# Patient Record
Sex: Female | Born: 1945 | Race: White | Hispanic: No | Marital: Married | Smoking: Former smoker
Health system: Southern US, Community
[De-identification: ages and names within clinical notes are randomized; demographics above are authoritative.]

## PROBLEM LIST (undated history)

## (undated) DIAGNOSIS — I10 Essential (primary) hypertension: Secondary | ICD-10-CM

## (undated) DIAGNOSIS — R7303 Prediabetes: Secondary | ICD-10-CM

## (undated) DIAGNOSIS — G720 Drug-induced myopathy: Secondary | ICD-10-CM

## (undated) DIAGNOSIS — N289 Disorder of kidney and ureter, unspecified: Secondary | ICD-10-CM

## (undated) DIAGNOSIS — E7800 Pure hypercholesterolemia, unspecified: Secondary | ICD-10-CM

## (undated) DIAGNOSIS — N182 Chronic kidney disease, stage 2 (mild): Secondary | ICD-10-CM

## (undated) DIAGNOSIS — E785 Hyperlipidemia, unspecified: Secondary | ICD-10-CM

## (undated) DIAGNOSIS — N8110 Cystocele, unspecified: Secondary | ICD-10-CM

## (undated) DIAGNOSIS — K219 Gastro-esophageal reflux disease without esophagitis: Secondary | ICD-10-CM

## (undated) DIAGNOSIS — J449 Chronic obstructive pulmonary disease, unspecified: Secondary | ICD-10-CM

## (undated) HISTORY — PX: ABDOMINAL HYSTERECTOMY: SHX81

## (undated) HISTORY — DX: Essential (primary) hypertension: I10

## (undated) HISTORY — DX: Cystocele, unspecified: N81.10

## (undated) HISTORY — PX: EYE SURGERY: SHX253

## (undated) HISTORY — DX: Pure hypercholesterolemia, unspecified: E78.00

## (undated) HISTORY — PX: TUBAL LIGATION: SHX77

## (undated) HISTORY — PX: TONSILLECTOMY: SUR1361

## (undated) HISTORY — PX: APPENDECTOMY: SHX54

## (undated) HISTORY — DX: Chronic kidney disease, stage 2 (mild): N18.2

---

## 2015-02-07 ENCOUNTER — Other Ambulatory Visit: Payer: Self-pay | Admitting: Internal Medicine

## 2015-02-07 DIAGNOSIS — D281 Benign neoplasm of vagina: Secondary | ICD-10-CM

## 2015-02-16 ENCOUNTER — Ambulatory Visit
Admission: RE | Admit: 2015-02-16 | Discharge: 2015-02-16 | Disposition: A | Discharge status: Home / Self Care | Payer: Medicare Other | Source: Ambulatory Visit | Attending: Internal Medicine | Admitting: Internal Medicine

## 2015-02-16 DIAGNOSIS — N858 Other specified noninflammatory disorders of uterus: Secondary | ICD-10-CM | POA: Insufficient documentation

## 2015-02-16 DIAGNOSIS — D281 Benign neoplasm of vagina: Secondary | ICD-10-CM | POA: Diagnosis not present

## 2015-02-16 DIAGNOSIS — Z86018 Personal history of other benign neoplasm: Secondary | ICD-10-CM | POA: Insufficient documentation

## 2015-10-15 ENCOUNTER — Other Ambulatory Visit: Payer: Self-pay | Admitting: Internal Medicine

## 2015-10-15 DIAGNOSIS — Z1231 Encounter for screening mammogram for malignant neoplasm of breast: Secondary | ICD-10-CM

## 2015-10-26 ENCOUNTER — Other Ambulatory Visit: Payer: Self-pay | Admitting: Internal Medicine

## 2015-10-26 ENCOUNTER — Ambulatory Visit
Admission: RE | Admit: 2015-10-26 | Discharge: 2015-10-26 | Disposition: A | Discharge status: Home / Self Care | Payer: Medicare Other | Source: Ambulatory Visit | Attending: Internal Medicine | Admitting: Internal Medicine

## 2015-10-26 DIAGNOSIS — Z1231 Encounter for screening mammogram for malignant neoplasm of breast: Secondary | ICD-10-CM | POA: Insufficient documentation

## 2016-09-10 ENCOUNTER — Other Ambulatory Visit: Payer: Self-pay | Admitting: Internal Medicine

## 2016-09-10 DIAGNOSIS — Z1231 Encounter for screening mammogram for malignant neoplasm of breast: Secondary | ICD-10-CM

## 2016-10-27 ENCOUNTER — Other Ambulatory Visit: Payer: Self-pay | Admitting: Internal Medicine

## 2016-10-27 ENCOUNTER — Ambulatory Visit
Admission: RE | Admit: 2016-10-27 | Discharge: 2016-10-27 | Disposition: A | Discharge status: Home / Self Care | Payer: Medicare Other | Source: Ambulatory Visit | Attending: Internal Medicine | Admitting: Internal Medicine

## 2016-10-27 DIAGNOSIS — Z1231 Encounter for screening mammogram for malignant neoplasm of breast: Secondary | ICD-10-CM | POA: Insufficient documentation

## 2017-01-06 ENCOUNTER — Encounter: Payer: Self-pay | Admitting: Obstetrics & Gynecology

## 2017-01-06 ENCOUNTER — Ambulatory Visit (INDEPENDENT_AMBULATORY_CARE_PROVIDER_SITE_OTHER): Payer: Medicare Other | Admitting: Obstetrics & Gynecology

## 2017-01-06 VITALS — BP 153/72 | HR 58 | Ht 63.0 in | Wt 143.9 lb

## 2017-01-06 DIAGNOSIS — N8111 Cystocele, midline: Secondary | ICD-10-CM | POA: Diagnosis present

## 2017-01-06 DIAGNOSIS — N393 Stress incontinence (female) (male): Secondary | ICD-10-CM

## 2017-01-06 DIAGNOSIS — N814 Uterovaginal prolapse, unspecified: Secondary | ICD-10-CM

## 2017-01-06 NOTE — Patient Instructions (Signed)
Anterior and Posterior Colporrhaphy and Sling Procedure Anterior and posterior colporrhaphy and sling procedure are combined surgical procedures that treat weakness in the front (anterior) or back (posterior) walls of your vagina. When weakness occurs in the anterior wall of the vagina, the bladder can bulge into the vagina (cystocele). When weakness occurs in the posterior wall of the vagina, the rectum can bulge into the vagina (rectocele). This condition is called pelvic organ prolapse. In this procedure, a surgical mesh sling will be placed around the tube that empties urine from your bladder (urethra). The sling will hold your urethra and bladder in place to prevent urine leakage (incontinence). This surgery is usually done through incisions in your vagina. It can also be done through incisions in your lower abdominal or groin area. You may need this surgery if pelvic organ prolapse causes symptoms that interfere with your daily life and cannot be corrected with other treatments. Tell a health care provider about:  Any allergies you have.  All medicines you are taking, including vitamins, herbs, eye drops, creams, and over-the-counter medicines.  Any problems you or family members have had with anesthetic medicines.  Any blood disorders you have.  Any surgeries you have had.  Any medical conditions you have.  Whether you are pregnant or may be pregnant. What are the risks? Generally, this is a safe procedure. However, problems may occur, including:  Infection.  Bleeding.  Allergic reactions to medicines or dyes.  Damage to other structures or organs.  Incontinence.  A blood clot that travels to your lung.  Nerve damage.  Painful sex.  Urine leakage into the vagina.  Constipation.  Tissue damage from the sling over time. The sling may need to be removed.  What happens before the procedure? Staying hydrated  Follow instructions from your health care provider about  hydration, which may include: ? Up to 2 hours before the procedure - you may continue to drink clear liquids, such as water, clear fruit juice, black coffee, and plain tea. Eating and drinking restrictions  Follow instructions from your health care provider about eating and drinking, which may include: ? 8 hours before the procedure - stop eating heavy meals or foods such as meat, fried foods, or fatty foods. ? 6 hours before the procedure - stop eating light meals or foods, such as toast or cereal. ? 6 hours before the procedure - stop drinking milk or drinks that contain milk. ? 2 hours before the procedure - stop drinking clear liquids. General instructions  Ask your health care provider about: ? Changing or stopping your regular medicines. This is especially important if you are taking diabetes medicines or blood thinners. ? Taking medicines such as aspirin and ibuprofen. These medicines can thin your blood. Do not take these medicines before your procedure if your health care provider instructs you not to.  You may be given antibiotics to help prevent infection.  You may be instructed to use estrogen cream in your vagina to help prevent complications and promote healing.  Plan to have someone take you home from the hospital. What happens during the procedure?  To reduce your risk of infection: ? Your health care team will wash or sanitize their hands. ? Your skin will be washed with soap. ? Hair may be removed from the surgical area.  An IV tube may be inserted into one of your veins.  You will be given one or more of the following: ? A medicine to help you relax (sedative). ?  A medicine that is injected into your spine to numb the area below and slightly above the injection site (spinal anesthetic). ? A medicine to make you fall asleep (general anesthetic).  You may be given antibiotics through your IV.  You will lie down on the operating table with your feet in  stirrups.  A small, thin tube (catheter) will be inserted through your urethra into your bladder to drain urine during surgery and recovery.  An instrument (vaginal speculum) will be used to hold your vagina open.  To correct a cystocele: ? An incision will be made in the front wall of your vagina. ? Your bladder will be placed back into normal position. ? Weak or excess vaginal lining may be removed. ? The front wall of your vagina will be closed with stitches (sutures).  To correct a rectocele: ? An incision will be made in the back wall of your vagina. ? Your rectum will be placed back into normal position. ? Weak or excess vaginal lining may be removed. ? The back wall of your vagina will be closed with sutures.  To place a sling: ? Small incisions may be made inside your vagina or in your lower abdominal or groin area to insert the sling. ? The sling will be placed around your urethra and attached to strong tissues inside your abdomen. ? These incisions may be closed with sutures or glue.  Gauze packing will be placed inside your vagina.  The procedure may vary among health care providers and hospitals. What happens after the procedure?  Your blood pressure, heart rate, breathing rate, and blood oxygen level will be monitored until the medicines you were given have worn off.  You will be given pain medicine as needed.  You will start on a liquid diet and move to a regular diet.  You will be encouraged to get up and walk as soon as you are able.  You may need to wear compression stockings. They help prevent blood clots and reduce swelling in your legs.  Your IV, urinary catheter, and vaginal packing may be removed before you go home.  Do not drive for 24 hours if you were given a sedative. Summary  Anterior and posterior colporrhaphy and sling procedure are combined surgical procedures that treat weakness in the anterior or posteriorwalls of your vagina.  Plan to have  someone take you home from the hospital or clinic.  After the procedure, you will be encouraged to start drinking and eating and be up and walking as soon as you are able.  The IV, urinary catheter, and vaginal packing may be removed before you go home. This information is not intended to replace advice given to you by your health care provider. Make sure you discuss any questions you have with your health care provider. Document Released: 06/16/2016 Document Revised: 06/16/2016 Document Reviewed: 06/16/2016 Elsevier Interactive Patient Education  2018 [employer]. Urinary Incontinence Urinary incontinence is the involuntary loss of urine from your bladder. What are the causes? There are many causes of urinary incontinence. They include:  Medicines.  Infections.  Prostatic enlargement, leading to overflow of urine from your bladder.  Surgery.  Neurological diseases.  Emotional factors.  What are the signs or symptoms? Urinary Incontinence can be divided into four types: 1. Urge incontinence. Urge incontinence is the involuntary loss of urine before you have the opportunity to go to the bathroom. There is a sudden urge to void but not enough time to reach a bathroom.  2. Stress incontinence. Stress incontinence is the sudden loss of urine with any activity that forces urine to pass. It is commonly caused by anatomical changes to the pelvis and sphincter areas of your body. 3. Overflow incontinence. Overflow incontinence is the loss of urine from an obstructed opening to your bladder. This results in a backup of urine and a resultant buildup of pressure within the bladder. When the pressure within the bladder exceeds the closing pressure of the sphincter, the urine overflows, which causes incontinence, similar to water overflowing a dam. 4. Total incontinence. Total incontinence is the loss of urine as a result of the inability to store urine within your bladder.  How is this  diagnosed? Evaluating the cause of incontinence may require:  A thorough and complete medical and obstetric history.  A complete physical exam.  Laboratory tests such as a urine culture and sensitivities.  When additional tests are indicated, they can include:  An ultrasound exam.  Kidney and bladder X-rays.  Cystoscopy. This is an exam of the bladder using a narrow scope.  Urodynamic testing to test the nerve function to the bladder and sphincter areas.  How is this treated? Treatment for urinary incontinence depends on the cause:  For urge incontinence caused by a bacterial infection, antibiotics will be prescribed. If the urge incontinence is related to medicines you take, your health care provider may have you change the medicine.  For stress incontinence, surgery to re-establish anatomical support to the bladder or sphincter, or both, will often correct the condition.  For overflow incontinence caused by an enlarged prostate, an operation to open the channel through the enlarged prostate will allow the flow of urine out of the bladder. In women with fibroids, a hysterectomy may be recommended.  For total incontinence, surgery on your urinary sphincter may help. An artificial urinary sphincter (an inflatable cuff placed around the urethra) may be required. In women who have developed a hole-like passage between their bladder and vagina (vesicovaginal fistula), surgery to close the fistula often is required.  Follow these instructions at home:  Normal daily hygiene and the use of pads or adult diapers that are changed regularly will help prevent odors and skin damage.  Avoid caffeine. It can overstimulate your bladder.  Use the bathroom regularly. Try about every 2-3 hours to go to the bathroom, even if you do not feel the need to do so. Take time to empty your bladder completely. After urinating, wait a minute. Then try to urinate again.  For causes involving nerve  dysfunction, keep a log of the medicines you take and a journal of the times you go to the bathroom. Contact a health care provider if:  You experience worsening of pain instead of improvement in pain after your procedure.  Your incontinence becomes worse instead of better. Get help right away if:  You experience fever or shaking chills.  You are unable to pass your urine.  You have redness spreading into your groin or down into your thighs. This information is not intended to replace advice given to you by your health care provider. Make sure you discuss any questions you have with your health care provider. Document Released: 07/24/2004 Document Revised: 01/25/2016 Document Reviewed: 11/23/2012 Elsevier Interactive Patient Education  [employer].

## 2017-01-06 NOTE — Progress Notes (Signed)
History:  71 y.o. 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 Her LMP was in her 74's. She was referred for eval of prolapse. She is not sure what is prolapsing. She finds it uncomfortable. She is very active with gardening and wants surgical management. She reports that she was seen prev by a GYN who tired 2 types of pessaries with no success. She reports that for the past month she has had leakage of urine with activity but, prior to 1 month prev she had no leakage.   The following portions of the patient's history were reviewed and updated as appropriate: allergies, current medications, past family history, past medical history, past social history, past surgical history and problem list.  Review of Systems:  Pertinent items are noted in HPI.   Objective:  Physical Exam Blood pressure (!) 153/72, pulse (!) 58, height 5\' 3"  (1.6 m), weight 143 lb 14.4 oz (65.3 kg), last menstrual period 11/28/2016. Gen: Very please and well appearing pt in NAD.  Abd: Soft, nontender and nondistended Pelvic: Normal appearing external genitalia; normal appearing vaginal mucosa and cervix.  There is a grade III cystocele.  Normal discharge.  Small uterus which is mildly prolapsed, no other palpable masses, no uterine or adnexal tenderness  PESSARY FITTING AND PLACEMENT During pelvic exam, patient was examined. Her vaginal introitus size, vaginal length, and prolapse stage were measured. Despite trying multiple pessaries of different sizes and types ALL of the attempted pessaries fell out with activity.  .   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 I attempted several pessaries but, even the largest one fell out with activity.   Schedule TVH with BSO and ant repair and sling with Dr. Hulan Fray and myself. Will pt appt with Dr. Hulan Fray prior to surgery.   Patient desires surgical management with TVH with ant repair and sling with Dr. Hulan Fray.  The risks of surgery were  discussed in detail with the patient including but not limited to: bleeding which may require transfusion or reoperation; infection which may require prolonged hospitalization or re-hospitalization and antibiotic therapy; injury to bowel, bladder, ureters and major vessels or other surrounding organs; need for additional procedures including laparotomy; thromboembolic phenomenon, incisional problems and other postoperative or anesthesia complications.  Patient was told that the likelihood that her condition and symptoms will be treated effectively with this surgical management was very high; the postoperative expectations were also discussed in detail. The patient also understands the alternative treatment options which were discussed in full. All questions were answered.  She was told that she will be contacted by our surgical scheduler regarding the time and date of her surgery; routine preoperative instructions of having nothing to eat or drink after midnight on the day prior to surgery and also coming to the hospital 1 1/2 hours prior to her time of surgery were also emphasized.  She was told she may be called for a preoperative appointment about a week prior to surgery and will be given further preoperative instructions at that visit. Printed patient education handouts about the procedure were given to the patient to review at home.  Total face-to-face time with patient was 30 min.  Greater than 50% was spent in counseling and coordination of care with the patient.   Calan Doren L. Ihor Dow, M.D., [place]   .   Marland Kitchen

## 2017-01-14 ENCOUNTER — Ambulatory Visit (INDEPENDENT_AMBULATORY_CARE_PROVIDER_SITE_OTHER): Payer: Medicare Other | Admitting: Obstetrics and Gynecology

## 2017-01-14 ENCOUNTER — Encounter: Payer: Self-pay | Admitting: Obstetrics and Gynecology

## 2017-01-14 VITALS — BP 139/78 | HR 56 | Ht 63.0 in | Wt 144.9 lb

## 2017-01-14 DIAGNOSIS — N816 Rectocele: Secondary | ICD-10-CM | POA: Insufficient documentation

## 2017-01-14 DIAGNOSIS — N3941 Urge incontinence: Secondary | ICD-10-CM | POA: Insufficient documentation

## 2017-01-14 DIAGNOSIS — N813 Complete uterovaginal prolapse: Secondary | ICD-10-CM | POA: Insufficient documentation

## 2017-01-14 DIAGNOSIS — R339 Retention of urine, unspecified: Secondary | ICD-10-CM | POA: Diagnosis not present

## 2017-01-14 DIAGNOSIS — N8111 Cystocele, midline: Secondary | ICD-10-CM | POA: Insufficient documentation

## 2017-01-14 DIAGNOSIS — N952 Postmenopausal atrophic vaginitis: Secondary | ICD-10-CM

## 2017-01-14 HISTORY — DX: Retention of urine, unspecified: R33.9

## 2017-01-14 HISTORY — DX: Postmenopausal atrophic vaginitis: N95.2

## 2017-01-14 HISTORY — DX: Urge incontinence: N39.41

## 2017-01-14 MED ORDER — MIRABEGRON ER 25 MG PO TB24: 25.0000 mg | ORAL_TABLET | Freq: Every day | ORAL | 6 refills | Status: DC

## 2017-01-14 MED ORDER — MIRABEGRON ER 25 MG PO TB24: 25.0000 mg | ORAL_TABLET | Freq: Every day | ORAL | Status: DC

## 2017-01-14 MED ORDER — ESTROGENS, CONJUGATED 0.625 MG/GM VA CREA: 0.5000 | TOPICAL_CREAM | Freq: Every day | VAGINAL | 12 refills | Status: DC

## 2017-01-14 NOTE — Patient Instructions (Addendum)
1. Schedule TVH BSO with anterior/posterior colporrhaphy for beginning of September 2018 2. Begin Merbetriq daily for urge incontinence 3. Begin Premarin cream 1/2 g intravaginal nightly for 30 days; then twice a week 4. Return for preoperative appointment week before surgery was

## 2017-01-14 NOTE — Progress Notes (Signed)
GYN ENCOUNTER NOTE  Subjective:  NEW PATIENT EVALUATION        Haley Mason is a 71 y.o. [phone] female is here for gynecologic evaluation of the following issues:  1. Pelvic organ prolapse, symptomatic  Status post NSVD 6 Recently evaluated at group clinic for pessary management for symptomatic pelvic organ prolapse without successful fitting of a functional pessary. Patient is not interested in further pessary trials. Patient was told that she needed TVH BSO with a sling procedure for her urinary symptoms and prolapse.  GU history: Frequency-10-20 per day Nocturia-3-4 per night She does report incomplete bladder emptying. She does report significant urgency with loss of urine when unable to get to the bathroom She has not had a trial of unstable bladder medications. She does not report any stress urinary incontinence symptoms. She does not wear any pads Patient does have chronic pelvic pressure with total prolapse of the uterus outside the introitus.  GI history: Bowel movement frequency-daily Patient does report mild splinting with bowel movements.  Patient is not currently sexually active because of the previous experience with painful intercourse related to the prolapse.     Gynecologic History Patient's last menstrual period was 11/28/2016 (within days). Contraception: post menopausal status   Obstetric History OB History  Gravida Para Term Preterm AB Living  6 6 6     6   SAB TAB Ectopic Multiple Live Births          6    # Outcome Date GA Lbr Len/2nd Weight Sex Delivery Anes PTL Lv  6 Term 1978   6 lb 8 oz (2.948 kg) F Vag-Spont   LIV  5 Term 1975   6 lb 12.8 oz (3.084 kg) F Vag-Spont   LIV  4 Term 1969   7 lb 4.8 oz (3.311 kg) M Vag-Spont   LIV  3 Term 1965   6 lb (2.722 kg) M Vag-Spont   LIV  2 Term 1964   5 lb 2.1 oz (2.327 kg) F Vag-Spont   LIV  1 Term 1962   6 lb 6.4 oz (2.903 kg) M Vag-Spont   LIV      Past Medical History:  Diagnosis Date  .  Cystocele, unspecified (CODE)   . High cholesterol   . Hypertension   . 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 TONSILLECTOMY    . TUBAL LIGATION      Current Outpatient Prescriptions on File Prior to Visit  Medication Sig Dispense Refill  . atenolol (TENORMIN) 25 MG tablet Take by mouth daily.    . Cholecalciferol (VITAMIN D3) 2000 units TABS Take by mouth.    . colesevelam (WELCHOL) 625 MG tablet Take 625 mg by mouth 2 (two) times daily with a meal.     No current facility-administered medications on file prior to visit.     Allergies  Allergen Reactions  . Penicillins Other (See Comments)    Loses muscle control  . Prednisone Nausea And Vomiting    Loses muscle control    Social History   Social History  . Marital status: Married    Spouse name: N/A  . Number of children: N/A  . Years of education: N/A   Occupational History  . Not on file.   Social History Main Topics  . Smoking status: Former Smoker    Quit date: 1991  . Smokeless tobacco: Never Used  . Alcohol use  No  . Drug use: No  . Sexual activity: Not Currently    Birth control/ protection: Surgical   Other Topics Concern  . Not on file   Social History Narrative  . No narrative on file    Family History  Problem Relation Age of Onset  . Diabetes Mother   . Heart disease Mother   . Diabetes Sister   . Diabetes Maternal Aunt   . Breast cancer Neg Hx   . Ovarian cancer Neg Hx   . Colon cancer Neg Hx     The following portions of the patient's history were reviewed and updated as appropriate: allergies, current medications, past family history, past medical history, past social history, past surgical history and problem list.  Review of Systems Per history of present illness Objective:   BP 139/78   Pulse (!) 56   Ht 5\' 3"  (1.6 m)   Wt 144 lb 14.4 oz (65.7 kg)   LMP 11/28/2016 (Within Days) Comment: spotting  BMI  25.67 kg/m  CONSTITUTIONAL: Well-developed, well-nourished female in no acute distress. Alert and oriented HENT:  Normocephalic, atraumatic.  NECK: Not examined.  SKIN: Skin is warm and dry. No rash noted. Not diaphoretic. No erythema. No pallor. [place]: Alert and oriented to person, place, and time. PSYCHIATRIC: Normal mood and affect. Normal behavior. Normal judgment and thought content. CARDIOVASCULAR:Not Examined RESPIRATORY: Not Examined BREASTS: Not Examined ABDOMEN: Soft, non distended; Non tender.  No Organomegaly. Questionable palpable hernia on right abdominal wall not exacerbated with Valsalva BACK: No CVA tenderness or spinal tenderness PELVIC:  External Genitalia: Normal  BUS: Normal  Vagina: Fair estrogen effect; third-degree traction cystocele present; moderate rectocele  Cervix: Normal; prolapsed outside introitus; no gross lesions  Uterus: Procidentia evident; uterus has Normal size, shape,consistency, mobility  Adnexa: Normal; nonpalpable nontender  RV: Normal external exam; normal sphincter tone; rectocele is confirmed  Bladder: Nontender MUSCULOSKELETAL: Normal range of motion. No tenderness.  No cyanosis, clubbing, or edema.  PROCEDURE: Postvoid residual Verbal consent is obtained. Patient is sent to the bathroom and empties bladder. Upon returning she was placed in the dorsal lithotomy position. The urethra is swabbed with Betadine swabs. Red Robinson catheter is used to drain the bladder of 60 mL of urine. Procedure was well-tolerated.   Assessment:   1. Urge incontinence  2. Procidentia of uterus  3. Cystocele, midline, Traction, third-degree  4. Rectocele, moderate, minimally symptomatic  5. Vaginal atrophy  6. Incomplete bladder emptying   The patient does not have any stress urinary incontinence symptoms and does not require a sling procedure at this time. She does understand that oftentimes with repair of pelvic organ prolapse, the patient may  develop stress urinary incontinence with subsequent need for sling later on.  Plan:   1. Post void residual is noted-60 mL 2. Premarin cream intravaginal 1/2 g nightly 30 days, then twice weekly 3. Trial of Merbetriq 25 mg daily 4. Return the end of August for follow-up and further preoperative assessment prior to surgery scheduled for September 5. TVH BSO with anterior/posterior colporrhaphy is to be scheduled for early September  A total of 45 minutes were spent face-to-face with the patient during the encounter with greater than 50% dealing with counseling and coordination of care.  Brayton Mars, MD  Note: This dictation was prepared with Dragon dictation along with smaller phrase technology. Any transcriptional errors that result from this process are unintentional.

## 2017-01-15 ENCOUNTER — Encounter (HOSPITAL_COMMUNITY): Payer: Self-pay

## 2017-01-15 ENCOUNTER — Telehealth: Payer: Self-pay | Admitting: Obstetrics & Gynecology

## 2017-01-15 NOTE — Telephone Encounter (Signed)
Per patient her family has found her a local doctor, and she wants to cancel this appointment.

## 2017-01-15 NOTE — Addendum Note (Signed)
Addended by: Elouise Munroe on: 01/15/2017 11:11 AM   Modules accepted: Orders

## 2017-01-16 ENCOUNTER — Encounter: Payer: Medicare Other | Admitting: Obstetrics & Gynecology

## 2017-01-17 LAB — URINE CULTURE

## 2017-01-19 ENCOUNTER — Telehealth: Payer: Self-pay | Admitting: Obstetrics and Gynecology

## 2017-01-19 NOTE — Telephone Encounter (Signed)
Patient called wanting to speak with "Dr.De's nurse Joyice Faster" The patient would like to speak with C.M. In regards to her wanting to get/pick up a prescription but was not able to get the medication. Patient did not disclose any other information. Please advise.

## 2017-01-19 NOTE — Telephone Encounter (Signed)
Pt states Venora Maples is too expensive. She states she does not want any other med at this time. She does not like the side effects. Aware I will let mad know.

## 2017-03-03 ENCOUNTER — Encounter: Payer: Self-pay | Admitting: Obstetrics and Gynecology

## 2017-03-03 ENCOUNTER — Ambulatory Visit (INDEPENDENT_AMBULATORY_CARE_PROVIDER_SITE_OTHER): Payer: Medicare Other | Admitting: Obstetrics and Gynecology

## 2017-03-03 VITALS — BP 125/79 | HR 70 | Ht 63.0 in | Wt 145.0 lb

## 2017-03-03 DIAGNOSIS — N816 Rectocele: Secondary | ICD-10-CM

## 2017-03-03 DIAGNOSIS — N813 Complete uterovaginal prolapse: Secondary | ICD-10-CM

## 2017-03-03 DIAGNOSIS — Z01818 Encounter for other preprocedural examination: Secondary | ICD-10-CM

## 2017-03-03 DIAGNOSIS — N8111 Cystocele, midline: Secondary | ICD-10-CM

## 2017-03-03 MED ORDER — LACTATED RINGERS IV SOLN: INTRAVENOUS | Status: DC

## 2017-03-03 NOTE — Progress Notes (Signed)
Subjective:  PREOPERATIVE HISTORY AND PHYSICAL   Date of surgery: 03/09/2017 Procedure: TVH BSO with anterior/posterior colporrhaphy Diagnosis: 1. Procidentia 2. Cystocele, third-degree 3. Rectocele   Patient is a 71 y.o. [id number] scheduled for TVH BSO with anterior/posterior colporrhaphy on 03/09/2017. The patient has symptomatic pelvic organ prolapse.  Status post NSVD 6 Recently evaluated at group clinic for pessary management for symptomatic pelvic organ prolapse without successful fitting of a functional pessary. Patient is not interested in further pessary trials. Patient was told that she needed TVH BSO with a sling procedure for her urinary symptoms and prolapse.  GU history: Frequency-10-20 per day Nocturia-3-4 per night She does report incomplete bladder emptying. She does report significant urgency with loss of urine when unable to get to the bathroom She has not had a trial of unstable bladder medications. She does not report any stress urinary incontinence symptoms. She does not wear any pads Patient does have chronic pelvic pressure with total prolapse of the uterus outside the introitus. GI history: Bowel movement frequency-daily Patient does report mild splinting with bowel movements.  Patient is not currently sexually active because of the previous experience with painful intercourse related to the prolapse  Gynecologic History Patient's last menstrual period was 11/28/2016 (within days). Contraception: post menopausal status   OB History    Gravida Para Term Preterm AB Living   6 6 6     6    SAB TAB Ectopic Multiple Live Births           6      Patient's last menstrual period was 11/28/2016 (within days).    Past Medical History:  Diagnosis Date  . Cystocele, unspecified (CODE)   . High cholesterol   . Hypertension   . 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  TONSILLECTOMY    . TUBAL LIGATION      OB History  Gravida Para Term Preterm AB Living  6 6 6     6   SAB TAB Ectopic Multiple Live Births          6    # Outcome Date GA Lbr Len/2nd Weight Sex Delivery Anes PTL Lv  6 Term 1978   6 lb 8 oz (2.948 kg) F Vag-Spont   LIV  5 Term 1975   6 lb 12.8 oz (3.084 kg) F Vag-Spont   LIV  4 Term 1969   7 lb 4.8 oz (3.311 kg) M Vag-Spont   LIV  3 Term 1965   6 lb (2.722 kg) M Vag-Spont   LIV  2 Term 1964   5 lb 2.1 oz (2.327 kg) F Vag-Spont   LIV  1 Term 1962   6 lb 6.4 oz (2.903 kg) M Vag-Spont   LIV      Social History   Social History  . Marital status: Married    Spouse name: N/A  . Number of children: N/A  . Years of education: N/A   Social History Main Topics  . Smoking status: Former Smoker    Quit date: 1991  . Smokeless tobacco: Never Used  . Alcohol use No  . Drug use: No  . Sexual activity: Not Currently    Birth control/ protection: Surgical   Other Topics Concern  . None   Social History Narrative  . None    Family History  Problem Relation Age of Onset  . Diabetes Mother   . Heart  disease Mother   . Diabetes Sister   . Diabetes Maternal Aunt   . Breast cancer Neg Hx   . Ovarian cancer Neg Hx   . Colon cancer Neg Hx      (Not in a hospital admission)  Allergies  Allergen Reactions  . Penicillins Other (See Comments)    Loses muscle control  . Prednisone Nausea And Vomiting    Loses muscle control    Review of Systems Constitutional: No recent fever/chills/sweats Respiratory: No recent cough/bronchitis Cardiovascular: No chest pain Gastrointestinal: No recent nausea/vomiting/diarrhea Genitourinary: No UTI symptoms Hematologic/lymphatic:No history of coagulopathy or recent blood thinner use    Objective:    BP 125/79   Pulse 70   Ht 5\' 3"  (1.6 m)   Wt 145 lb (65.8 kg)   LMP 11/28/2016 (Within Days) Comment: spotting  BMI 25.69 kg/m   General:   Normal  Skin:   normal  HEENT:  Normal   Neck:  Supple without Adenopathy or Thyromegaly  Lungs:   Heart:              Breasts:   Abdomen:  Pelvis:  M/S   Extremeties:  Neuro:    clear to auscultation bilaterally   Normal without murmur   Not Examined   soft, non-tender; bowel sounds normal; no masses,  no organomegaly   Exam deferred to OR  No CVAT  Warm/Dry   Normal          Assessment:  1. Uterine procidentia 2. Cystocele, third-degree, traction, midline 3. Rectocele, moderate, minimally symptomatic 4. Urge incontinence 5. Incomplete bladder emptying 6. Vaginal atrophy   Plan:  TVH BSO with anterior/posterior colporrhaphy    Preop counseling: The patient is to undergo TVH BSO with anterior/posterior colporrhaphy for management of symptomatic pelvic organ prolapse. She is understanding of the planned procedures and is aware of and is accepting of all surgical risks which include but are not limited to bleeding, infection, pelvic organ injury with need for repair, blood clot disorders, anesthesia risks, etc. Patient does understand that stress incontinence may develop after the surgery which may require a sling in the future. She is accepting of all risks and wishes to proceed with surgery. All questions have been answered.  Brayton Mars, MD  Note: This dictation was prepared with Dragon dictation along with smaller phrase technology. Any transcriptional errors that result from this process are unintentional.

## 2017-03-03 NOTE — Patient Instructions (Signed)
Return for postop check 1 week after surgery

## 2017-03-03 NOTE — H&P (Signed)
Subjective:  PREOPERATIVE HISTORY AND PHYSICAL   Date of surgery: 03/09/2017 Procedure: TVH BSO with anterior/posterior colporrhaphy Diagnosis: 1. Procidentia 2. Cystocele, third-degree 3. Rectocele   Patient is a 71 y.o. [id number] scheduled for TVH BSO with anterior/posterior colporrhaphy on 03/09/2017. The patient has symptomatic pelvic organ prolapse.  Status post NSVD 6 Recently evaluated at group clinic for pessary management for symptomatic pelvic organ prolapse without successful fitting of a functional pessary. Patient is not interested in further pessary trials. Patient was told that she needed TVH BSO with a sling procedure for her urinary symptoms and prolapse.  GU history: Frequency-10-20 per day Nocturia-3-4 per night She does report incomplete bladder emptying. She does report significant urgency with loss of urine when unable to get to the bathroom She has not had a trial of unstable bladder medications. She does not report any stress urinary incontinence symptoms. She does not wear any pads Patient does have chronic pelvic pressure with total prolapse of the uterus outside the introitus. GI history: Bowel movement frequency-daily Patient does report mild splinting with bowel movements.  Patient is not currently sexually active because of the previous experience with painful intercourse related to the prolapse  Gynecologic History Patient's last menstrual period was 11/28/2016 (within days). Contraception: post menopausal status   OB History    Gravida Para Term Preterm AB Living   6 6 6     6    SAB TAB Ectopic Multiple Live Births           6      Patient's last menstrual period was 11/28/2016 (within days).    Past Medical History:  Diagnosis Date  . Cystocele, unspecified (CODE)   . High cholesterol   . Hypertension   . 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  TONSILLECTOMY    . TUBAL LIGATION      OB History  Gravida Para Term Preterm AB Living  6 6 6     6   SAB TAB Ectopic Multiple Live Births          6    # Outcome Date GA Lbr Len/2nd Weight Sex Delivery Anes PTL Lv  6 Term 1978   6 lb 8 oz (2.948 kg) F Vag-Spont   LIV  5 Term 1975   6 lb 12.8 oz (3.084 kg) F Vag-Spont   LIV  4 Term 1969   7 lb 4.8 oz (3.311 kg) M Vag-Spont   LIV  3 Term 1965   6 lb (2.722 kg) M Vag-Spont   LIV  2 Term 1964   5 lb 2.1 oz (2.327 kg) F Vag-Spont   LIV  1 Term 1962   6 lb 6.4 oz (2.903 kg) M Vag-Spont   LIV      Social History   Social History  . Marital status: Married    Spouse name: N/A  . Number of children: N/A  . Years of education: N/A   Social History Main Topics  . Smoking status: Former Smoker    Quit date: 1991  . Smokeless tobacco: Never Used  . Alcohol use No  . Drug use: No  . Sexual activity: Not Currently    Birth control/ protection: Surgical   Other Topics Concern  . None   Social History Narrative  . None    Family History  Problem Relation Age of Onset  . Diabetes Mother   . Heart  disease Mother   . Diabetes Sister   . Diabetes Maternal Aunt   . Breast cancer Neg Hx   . Ovarian cancer Neg Hx   . Colon cancer Neg Hx      (Not in a hospital admission)  Allergies  Allergen Reactions  . Penicillins Other (See Comments)    Loses muscle control  . Prednisone Nausea And Vomiting    Loses muscle control    Review of Systems Constitutional: No recent fever/chills/sweats Respiratory: No recent cough/bronchitis Cardiovascular: No chest pain Gastrointestinal: No recent nausea/vomiting/diarrhea Genitourinary: No UTI symptoms Hematologic/lymphatic:No history of coagulopathy or recent blood thinner use    Objective:    BP 125/79   Pulse 70   Ht 5\' 3"  (1.6 m)   Wt 145 lb (65.8 kg)   LMP 11/28/2016 (Within Days) Comment: spotting  BMI 25.69 kg/m   General:   Normal  Skin:   normal  HEENT:  Normal   Neck:  Supple without Adenopathy or Thyromegaly  Lungs:   Heart:              Breasts:   Abdomen:  Pelvis:  M/S   Extremeties:  Neuro:    clear to auscultation bilaterally   Normal without murmur   Not Examined   soft, non-tender; bowel sounds normal; no masses,  no organomegaly   Exam deferred to OR  No CVAT  Warm/Dry   Normal          Assessment:  1. Uterine procidentia 2. Cystocele, third-degree, traction, midline 3. Rectocele, moderate, minimally symptomatic 4. Urge incontinence 5. Incomplete bladder emptying 6. Vaginal atrophy   Plan:  TVH BSO with anterior/posterior colporrhaphy    Preop counseling: The patient is to undergo TVH BSO with anterior/posterior colporrhaphy for management of symptomatic pelvic organ prolapse. She is understanding of the planned procedures and is aware of and is accepting of all surgical risks which include but are not limited to bleeding, infection, pelvic organ injury with need for repair, blood clot disorders, anesthesia risks, etc. Patient does understand that stress incontinence may develop after the surgery which may require a sling in the future. She is accepting of all risks and wishes to proceed with surgery. All questions have been answered.  Brayton Mars, MD  Note: This dictation was prepared with Dragon dictation along with smaller phrase technology. Any transcriptional errors that result from this process are unintentional.

## 2017-03-05 ENCOUNTER — Encounter
Admission: RE | Admit: 2017-03-05 | Discharge: 2017-03-05 | Disposition: A | Discharge status: Home / Self Care | Payer: Medicare Other | Source: Ambulatory Visit | Attending: Obstetrics and Gynecology | Admitting: Obstetrics and Gynecology

## 2017-03-05 DIAGNOSIS — I10 Essential (primary) hypertension: Secondary | ICD-10-CM | POA: Diagnosis not present

## 2017-03-05 DIAGNOSIS — I498 Other specified cardiac arrhythmias: Secondary | ICD-10-CM | POA: Insufficient documentation

## 2017-03-05 DIAGNOSIS — Z01818 Encounter for other preprocedural examination: Secondary | ICD-10-CM | POA: Insufficient documentation

## 2017-03-05 DIAGNOSIS — N3941 Urge incontinence: Secondary | ICD-10-CM | POA: Diagnosis not present

## 2017-03-05 DIAGNOSIS — N813 Complete uterovaginal prolapse: Secondary | ICD-10-CM | POA: Diagnosis not present

## 2017-03-05 LAB — TYPE AND SCREEN
ABO/RH(D): B POS
Antibody Screen: NEGATIVE

## 2017-03-05 LAB — CBC WITH DIFFERENTIAL/PLATELET
BASOS ABS: 0.1 10*3/uL (ref 0–0.1)
Basophils Relative: 1 %
EOS PCT: 3 %
Eosinophils Absolute: 0.2 10*3/uL (ref 0–0.7)
HEMATOCRIT: 44.5 % (ref 35.0–47.0)
Hemoglobin: 15.2 g/dL (ref 12.0–16.0)
LYMPHS PCT: 20 %
Lymphs Abs: 1.5 10*3/uL (ref 1.0–3.6)
MCH: 29.3 pg (ref 26.0–34.0)
MCHC: 34.2 g/dL (ref 32.0–36.0)
MCV: 85.5 fL (ref 80.0–100.0)
MONO ABS: 0.6 10*3/uL (ref 0.2–0.9)
MONOS PCT: 8 %
NEUTROS ABS: 5.3 10*3/uL (ref 1.4–6.5)
Neutrophils Relative %: 68 %
PLATELETS: 223 10*3/uL (ref 150–440)
RBC: 5.21 MIL/uL — ABNORMAL HIGH (ref 3.80–5.20)
RDW: 13.3 % (ref 11.5–14.5)
WBC: 7.7 10*3/uL (ref 3.6–11.0)

## 2017-03-05 LAB — RAPID HIV SCREEN (HIV 1/2 AB+AG)
HIV 1/2 Antibodies: NONREACTIVE
HIV-1 P24 Antigen - HIV24: NONREACTIVE

## 2017-03-05 NOTE — Patient Instructions (Signed)
  Your procedure is scheduled on: 03/09/17 Report to Day Surgery.[facility]OND FLOOR To find out your arrival time please call [phone] between 1PM - 3PM on 03/06/17.  Remember: Instructions that are not followed completely may result in serious medical risk, up to and including death, or upon the discretion of your surgeon and anesthesiologist your surgery may need to be rescheduled.    _X___ 1. Do not eat food after midnight the night before your procedure. No gum chewing or hard candies. You may drink clear liquids up to 2 hours before you are scheduled to arrive for your surgery- DO not drink clear liquids within 2 hours of the start of your surgery.  Clear Liquids include: water, apple juice without pulp, clear carbohydrate drink such as Clearfast of Gartorade, Black Coffee or Tea (Do not add anything to coffee or tea).    ____ 2. No Alcohol for 24 hours before or after surgery.   ____ 3. Do Not Smoke For 24 Hours Prior to Your Surgery.   ____ 4. Bring all medications with you on the day of surgery if instructed.    _X___ 5. Notify your doctor if there is any change in your medical condition     (cold, fever, infections).       Do not wear jewelry, make-up, hairpins, clips or nail polish.  Do not wear lotions, powders, or perfumes. You may wear deodorant.  Do not shave 48 hours prior to surgery. Men may shave face and neck.  Do not bring valuables to the hospital.    [facility] is not responsible for any belongings or valuables.               Contacts, dentures or bridgework may not be worn into surgery.  Leave your suitcase in the car. After surgery it may be brought to your room.  For patients admitted to the hospital, discharge time is determined by your                treatment team.   Patients discharged the day of surgery will not be allowed to drive home.   Please read over the following fact sheets that you were given:   Surgical Site  Infection Prevention   _X_ Take these medicines the morning of surgery with A SIP OF WATER:    1. ATENOLOL  2.   3.   4.  5.  6.  ____ Fleet Enema (as directed)   _X___ Use CHG Soap as directed  ____ Use inhalers on the day of surgery  ____ Stop metformin 2 days prior to surgery    ____ Take 1/2 of usual insulin dose the night before surgery and none on the morning of surgery.   ____ Stop Coumadin/Plavix/aspirin on   ____ Stop Anti-inflammatories on    ____ Stop supplements until after surgery.    ____ Bring C-Pap to the hospital.

## 2017-03-06 LAB — SYPHILIS: RPR W/REFLEX TO RPR TITER AND TREPONEMAL ANTIBODIES, TRADITIONAL SCREENING AND DIAGNOSIS ALGORITHM: RPR Ser Ql: NONREACTIVE

## 2017-03-08 MED ORDER — CEFAZOLIN SODIUM-DEXTROSE 2-4 GM/100ML-% IV SOLN
2.0000 g | INTRAVENOUS | Status: AC
  Administered 2017-03-09: 2 g via INTRAVENOUS

## 2017-03-09 ENCOUNTER — Ambulatory Visit: Payer: Medicare Other | Admitting: Anesthesiology

## 2017-03-09 ENCOUNTER — Encounter
Admission: RE | Disposition: A | Discharge status: Home / Self Care | Payer: Self-pay | Source: Ambulatory Visit | Attending: Obstetrics and Gynecology

## 2017-03-09 ENCOUNTER — Encounter: Payer: Self-pay | Admitting: *Deleted

## 2017-03-09 ENCOUNTER — Observation Stay
Admission: RE | Admit: 2017-03-09 | Discharge: 2017-03-10 | Disposition: A | Discharge status: Home / Self Care | Payer: Medicare Other | Source: Ambulatory Visit | Attending: Obstetrics and Gynecology | Admitting: Obstetrics and Gynecology

## 2017-03-09 DIAGNOSIS — N840 Polyp of corpus uteri: Secondary | ICD-10-CM | POA: Insufficient documentation

## 2017-03-09 DIAGNOSIS — Z79899 Other long term (current) drug therapy: Secondary | ICD-10-CM | POA: Insufficient documentation

## 2017-03-09 DIAGNOSIS — N952 Postmenopausal atrophic vaginitis: Secondary | ICD-10-CM | POA: Diagnosis not present

## 2017-03-09 DIAGNOSIS — Z87891 Personal history of nicotine dependence: Secondary | ICD-10-CM | POA: Insufficient documentation

## 2017-03-09 DIAGNOSIS — N879 Dysplasia of cervix uteri, unspecified: Secondary | ICD-10-CM | POA: Insufficient documentation

## 2017-03-09 DIAGNOSIS — N83332 Acquired atrophy of left ovary and fallopian tube: Secondary | ICD-10-CM | POA: Insufficient documentation

## 2017-03-09 DIAGNOSIS — Z90710 Acquired absence of both cervix and uterus: Secondary | ICD-10-CM | POA: Diagnosis not present

## 2017-03-09 DIAGNOSIS — N72 Inflammatory disease of cervix uteri: Secondary | ICD-10-CM | POA: Insufficient documentation

## 2017-03-09 DIAGNOSIS — I129 Hypertensive chronic kidney disease with stage 1 through stage 4 chronic kidney disease, or unspecified chronic kidney disease: Secondary | ICD-10-CM | POA: Diagnosis not present

## 2017-03-09 DIAGNOSIS — N813 Complete uterovaginal prolapse: Principal | ICD-10-CM | POA: Insufficient documentation

## 2017-03-09 DIAGNOSIS — N83331 Acquired atrophy of right ovary and fallopian tube: Secondary | ICD-10-CM | POA: Diagnosis not present

## 2017-03-09 DIAGNOSIS — N182 Chronic kidney disease, stage 2 (mild): Secondary | ICD-10-CM | POA: Diagnosis not present

## 2017-03-09 HISTORY — PX: ANTERIOR AND POSTERIOR REPAIR WITH SACROSPINOUS FIXATION: SHX6536

## 2017-03-09 HISTORY — PX: LAPAROSCOPIC VAGINAL HYSTERECTOMY WITH SALPINGO OOPHORECTOMY: SHX6681

## 2017-03-09 LAB — GLUCOSE, CAPILLARY: Glucose-Capillary: 162 mg/dL — ABNORMAL HIGH (ref 65–99)

## 2017-03-09 LAB — ABO/RH: ABO/RH(D): B POS

## 2017-03-09 SURGERY — HYSTERECTOMY, VAGINAL, LAPAROSCOPY-ASSISTED, WITH SALPINGO-OOPHORECTOMY
Anesthesia: General | Site: Vagina | Wound class: Clean Contaminated

## 2017-03-09 MED ORDER — ROCURONIUM BROMIDE 100 MG/10ML IV SOLN
INTRAVENOUS | Status: DC | PRN
  Administered 2017-03-09: 30 mg via INTRAVENOUS
  Administered 2017-03-09: 40 mg via INTRAVENOUS

## 2017-03-09 MED ORDER — ACETAMINOPHEN NICU IV SYRINGE 10 MG/ML
INTRAVENOUS | Status: AC
  Filled 2017-03-09: qty 1

## 2017-03-09 MED ORDER — ESTROGENS, CONJUGATED 0.625 MG/GM VA CREA
TOPICAL_CREAM | VAGINAL | Status: DC | PRN
  Administered 2017-03-09: 1 via VAGINAL

## 2017-03-09 MED ORDER — ATENOLOL 25 MG PO TABS
25.0000 mg | ORAL_TABLET | Freq: Every day | ORAL | Status: DC
  Filled 2017-03-09: qty 1

## 2017-03-09 MED ORDER — VASOPRESSIN 20 UNIT/ML IV SOLN
INTRAVENOUS | Status: AC
  Filled 2017-03-09: qty 1

## 2017-03-09 MED ORDER — FENTANYL CITRATE (PF) 100 MCG/2ML IJ SOLN
INTRAMUSCULAR | Status: AC
  Administered 2017-03-09: 25 ug via INTRAVENOUS
  Filled 2017-03-09: qty 2

## 2017-03-09 MED ORDER — COLESEVELAM HCL 625 MG PO TABS
1875.0000 mg | ORAL_TABLET | Freq: Two times a day (BID) | ORAL | Status: DC
  Filled 2017-03-09 (×2): qty 3

## 2017-03-09 MED ORDER — PROPOFOL 10 MG/ML IV BOLUS
INTRAVENOUS | Status: AC
  Filled 2017-03-09: qty 20

## 2017-03-09 MED ORDER — DOCUSATE SODIUM 100 MG PO CAPS
100.0000 mg | ORAL_CAPSULE | Freq: Two times a day (BID) | ORAL | Status: DC
  Administered 2017-03-10: 100 mg via ORAL
  Filled 2017-03-09: qty 1

## 2017-03-09 MED ORDER — LIDOCAINE HCL (CARDIAC) 20 MG/ML IV SOLN
INTRAVENOUS | Status: DC | PRN
  Administered 2017-03-09: 100 mg via INTRAVENOUS

## 2017-03-09 MED ORDER — LACTATED RINGERS IV SOLN
INTRAVENOUS | Status: DC
  Administered 2017-03-09: 11:00:00 via INTRAVENOUS

## 2017-03-09 MED ORDER — DEXAMETHASONE SODIUM PHOSPHATE 10 MG/ML IJ SOLN
INTRAMUSCULAR | Status: DC | PRN
  Administered 2017-03-09: 10 mg via INTRAVENOUS

## 2017-03-09 MED ORDER — PROPOFOL 10 MG/ML IV BOLUS
INTRAVENOUS | Status: DC | PRN
  Administered 2017-03-09: 150 mg via INTRAVENOUS

## 2017-03-09 MED ORDER — ADULT MULTIVITAMIN W/MINERALS CH
1.0000 | ORAL_TABLET | Freq: Every day | ORAL | Status: DC
  Administered 2017-03-10: 1 via ORAL
  Filled 2017-03-09: qty 1

## 2017-03-09 MED ORDER — LACTATED RINGERS IV SOLN
INTRAVENOUS | Status: DC
  Administered 2017-03-10: 01:00:00 via INTRAVENOUS

## 2017-03-09 MED ORDER — LACTATED RINGERS IV SOLN
INTRAVENOUS | Status: DC
  Administered 2017-03-09: 12:00:00 via INTRAVENOUS

## 2017-03-09 MED ORDER — ONDANSETRON HCL 4 MG/2ML IJ SOLN
INTRAMUSCULAR | Status: AC
Start: 2017-03-09 — End: ?
  Filled 2017-03-09: qty 2

## 2017-03-09 MED ORDER — FENTANYL CITRATE (PF) 100 MCG/2ML IJ SOLN
25.0000 ug | INTRAMUSCULAR | Status: DC | PRN
  Administered 2017-03-09 (×4): 25 ug via INTRAVENOUS

## 2017-03-09 MED ORDER — OXYCODONE-ACETAMINOPHEN 5-325 MG PO TABS
1.0000 | ORAL_TABLET | ORAL | Status: DC | PRN
  Administered 2017-03-09: 1 via ORAL
  Filled 2017-03-09: qty 1

## 2017-03-09 MED ORDER — ACETAMINOPHEN 325 MG PO TABS
650.0000 mg | ORAL_TABLET | ORAL | Status: DC | PRN
  Administered 2017-03-09 – 2017-03-10 (×2): 650 mg via ORAL
  Filled 2017-03-09: qty 2

## 2017-03-09 MED ORDER — FAMOTIDINE 20 MG PO TABS
20.0000 mg | ORAL_TABLET | Freq: Once | ORAL | Status: AC
  Administered 2017-03-09: 20 mg via ORAL

## 2017-03-09 MED ORDER — BISACODYL 10 MG RE SUPP: 10.0000 mg | Freq: Every day | RECTAL | Status: DC | PRN

## 2017-03-09 MED ORDER — SUGAMMADEX SODIUM 200 MG/2ML IV SOLN
INTRAVENOUS | Status: DC | PRN
  Administered 2017-03-09: 200 mg via INTRAVENOUS

## 2017-03-09 MED ORDER — ESTROGENS, CONJUGATED 0.625 MG/GM VA CREA
TOPICAL_CREAM | VAGINAL | Status: AC
  Filled 2017-03-09: qty 30

## 2017-03-09 MED ORDER — FENTANYL CITRATE (PF) 250 MCG/5ML IJ SOLN
INTRAMUSCULAR | Status: AC
  Filled 2017-03-09: qty 5

## 2017-03-09 MED ORDER — SIMETHICONE 80 MG PO CHEW: 80.0000 mg | CHEWABLE_TABLET | Freq: Four times a day (QID) | ORAL | Status: DC | PRN

## 2017-03-09 MED ORDER — ACETAMINOPHEN 325 MG PO TABS
650.0000 mg | ORAL_TABLET | Freq: Every day | ORAL | Status: DC | PRN
  Filled 2017-03-09: qty 2

## 2017-03-09 MED ORDER — FAMOTIDINE 20 MG PO TABS
ORAL_TABLET | ORAL | Status: AC
  Filled 2017-03-09: qty 1

## 2017-03-09 MED ORDER — ONDANSETRON HCL 4 MG/2ML IJ SOLN
INTRAMUSCULAR | Status: DC | PRN
  Administered 2017-03-09: 4 mg via INTRAVENOUS

## 2017-03-09 MED ORDER — MORPHINE SULFATE (PF) 2 MG/ML IV SOLN: 1.0000 mg | INTRAVENOUS | Status: DC | PRN

## 2017-03-09 MED ORDER — CEFAZOLIN SODIUM-DEXTROSE 2-4 GM/100ML-% IV SOLN
INTRAVENOUS | Status: AC
  Filled 2017-03-09: qty 100

## 2017-03-09 MED ORDER — VITAMIN D3 25 MCG (1000 UNIT) PO TABS
2000.0000 [IU] | ORAL_TABLET | Freq: Every day | ORAL | Status: DC
  Administered 2017-03-10: 2000 [IU] via ORAL
  Filled 2017-03-09 (×2): qty 2

## 2017-03-09 MED ORDER — ONDANSETRON HCL 4 MG/2ML IJ SOLN: 4.0000 mg | Freq: Once | INTRAMUSCULAR | Status: DC | PRN

## 2017-03-09 MED ORDER — ACETAMINOPHEN 10 MG/ML IV SOLN
INTRAVENOUS | Status: DC | PRN
  Administered 2017-03-09: 1000 mg via INTRAVENOUS

## 2017-03-09 MED ORDER — LIDOCAINE-EPINEPHRINE 1 %-1:100000 IJ SOLN
INTRAMUSCULAR | Status: AC
  Filled 2017-03-09: qty 1

## 2017-03-09 MED ORDER — MIRABEGRON ER 25 MG PO TB24
25.0000 mg | ORAL_TABLET | Freq: Every day | ORAL | Status: DC
  Filled 2017-03-09: qty 1

## 2017-03-09 MED ORDER — FENTANYL CITRATE (PF) 100 MCG/2ML IJ SOLN
INTRAMUSCULAR | Status: DC | PRN
  Administered 2017-03-09 (×3): 50 ug via INTRAVENOUS

## 2017-03-09 MED ORDER — LACTATED RINGERS IV SOLN
INTRAVENOUS | Status: DC
  Administered 2017-03-09: 17:00:00 via INTRAVENOUS

## 2017-03-09 SURGICAL SUPPLY — 58 items
BAG URO DRAIN 2000ML W/SPOUT (MISCELLANEOUS) ×3 IMPLANT
BLADE CLIPPER SURG (BLADE) ×3 IMPLANT
BLADE SURG SZ11 CARB STEEL (BLADE) ×3 IMPLANT
CANISTER SUCT 1200ML W/VALVE (MISCELLANEOUS) ×3 IMPLANT
CATH FOLEY 2WAY  5CC 16FR (CATHETERS) ×1
CATH URTH 16FR FL 2W BLN LF (CATHETERS) ×2 IMPLANT
CHLORAPREP W/TINT 26ML (MISCELLANEOUS) IMPLANT
CORD MONOPOLAR M/FML 12FT (MISCELLANEOUS) IMPLANT
DERMABOND ADVANCED (GAUZE/BANDAGES/DRESSINGS) ×1
DERMABOND ADVANCED .7 DNX12 (GAUZE/BANDAGES/DRESSINGS) ×2 IMPLANT
DRAPE PERI LITHO V/GYN (MISCELLANEOUS) ×3 IMPLANT
DRAPE SHEET LG 3/4 BI-LAMINATE (DRAPES) ×3 IMPLANT
DRAPE UNDER BUTTOCK W/FLU (DRAPES) ×3 IMPLANT
DRSG TEGADERM 2X2.25 PEDS (GAUZE/BANDAGES/DRESSINGS) IMPLANT
ELECT REM PT RETURN 9FT ADLT (ELECTROSURGICAL) ×3
ELECTRODE REM PT RTRN 9FT ADLT (ELECTROSURGICAL) ×2 IMPLANT
GAUZE PACK 2X3YD (MISCELLANEOUS) ×3 IMPLANT
GAUZE PETRO XEROFOAM 1X8 (MISCELLANEOUS) IMPLANT
GLOVE BIO SURGEON STRL SZ 6.5 (GLOVE) ×6 IMPLANT
GLOVE BIO SURGEON STRL SZ8 (GLOVE) ×15 IMPLANT
GLOVE INDICATOR 7.0 STRL GRN (GLOVE) ×3 IMPLANT
GLOVE INDICATOR 8.0 STRL GRN (GLOVE) ×3 IMPLANT
GOWN STRL REUS W/ TWL LRG LVL3 (GOWN DISPOSABLE) ×4 IMPLANT
GOWN STRL REUS W/ TWL XL LVL3 (GOWN DISPOSABLE) ×4 IMPLANT
GOWN STRL REUS W/TWL LRG LVL3 (GOWN DISPOSABLE) ×2
GOWN STRL REUS W/TWL XL LVL3 (GOWN DISPOSABLE) ×2
IRRIGATION STRYKERFLOW (MISCELLANEOUS) IMPLANT
IRRIGATOR STRYKERFLOW (MISCELLANEOUS)
IV LACTATED RINGERS 1000ML (IV SOLUTION) ×3 IMPLANT
KIT PINK PAD W/HEAD ARE REST (MISCELLANEOUS)
KIT PINK PAD W/HEAD ARM REST (MISCELLANEOUS) IMPLANT
KIT RM TURNOVER CYSTO AR (KITS) ×3 IMPLANT
LABEL OR SOLS (LABEL) ×3 IMPLANT
NS IRRIG 500ML POUR BTL (IV SOLUTION) ×3 IMPLANT
PACK BASIN MINOR ARMC (MISCELLANEOUS) ×3 IMPLANT
PACK GYN LAPAROSCOPIC (MISCELLANEOUS) IMPLANT
PAD OB MATERNITY 4.3X12.25 (PERSONAL CARE ITEMS) ×3 IMPLANT
PAD PREP 24X41 OB/GYN DISP (PERSONAL CARE ITEMS) ×3 IMPLANT
PENCIL ELECTRO HAND CTR (MISCELLANEOUS) ×3 IMPLANT
SCISSORS METZENBAUM CVD 33 (INSTRUMENTS) IMPLANT
SHEARS HARMONIC ACE PLUS 36CM (ENDOMECHANICALS) IMPLANT
SLEEVE ENDOPATH XCEL 5M (ENDOMECHANICALS) IMPLANT
SPONGE XRAY 4X4 16PLY STRL (MISCELLANEOUS) ×3 IMPLANT
SUT CHROMIC 2 0 CT 1 (SUTURE) ×18 IMPLANT
SUT MNCRL 4-0 (SUTURE)
SUT MNCRL 4-0 27XMFL (SUTURE)
SUT VIC AB 0 CT1 27 (SUTURE) ×3
SUT VIC AB 0 CT1 27XCR 8 STRN (SUTURE) ×6 IMPLANT
SUT VIC AB 0 CT1 36 (SUTURE) ×6 IMPLANT
SUT VIC AB 0 CT2 27 (SUTURE) ×6 IMPLANT
SUT VIC AB 2-0 CT1 27 (SUTURE) ×2
SUT VIC AB 2-0 CT1 TAPERPNT 27 (SUTURE) ×4 IMPLANT
SUT VIC AB 2-0 UR6 27 (SUTURE) ×6 IMPLANT
SUTURE MNCRL 4-0 27XMF (SUTURE) IMPLANT
SYRINGE 10CC LL (SYRINGE) ×3 IMPLANT
TAPE TRANSPORE STRL 2 31045 (GAUZE/BANDAGES/DRESSINGS) IMPLANT
TROCAR XCEL NON-BLD 5MMX100MML (ENDOMECHANICALS) IMPLANT
TUBING INSUF HEATED (TUBING) ×3 IMPLANT

## 2017-03-09 NOTE — Anesthesia Post-op Follow-up Note (Signed)
Anesthesia QCDR form completed.        

## 2017-03-09 NOTE — Interval H&P Note (Signed)
History and Physical Interval Note:  03/09/2017 10:55 AM  Haley Mason  has presented today for surgery, with the diagnosis of URGE INCONTINENCE, PROCIDENTIA OF UTERUS, CYSTOCELE, RECTOCELE, VAGINAL ATROPHY, INCOMPLETE BLADDER EMPTYING  The various methods of treatment have been discussed with the patient and family. After consideration of risks, benefits and other options for treatment, the patient has consented to  Procedure(s): [place] (Bilateral) ANTERIOR AND POSTERIOR REPAIR WITH SACROSPINOUS FIXATION (N/A) as a surgical intervention .  The patient's history has been reviewed, patient examined, no change in status, stable for surgery.  I have reviewed the patient's chart and labs.  Questions were answered to the patient's satisfaction.     Hassell Done A Arsal Tappan

## 2017-03-09 NOTE — Op Note (Signed)
OPERATIVE NOTE:  Leshawn Houseworth PROCEDURE DATE: 03/09/2017   PREOPERATIVE DIAGNOSIS:  1. Symptomatic pelvic organ prolapse (uterine procidentia; third-degree cystocele; moderate rectocele  POSTOPERATIVE DIAGNOSIS:  Same as above  PROCEDURE:  TVH BSO with anterior/posterior colporrhaphy   SURGEON:  Brayton Mars, MD ASSISTANTS: Jeannie Fend, M.D. ANESTHESIA: General INDICATIONS: 71 y.o. [id number] who presents for surgical management of symptomatic pelvic organ prolapse. Pessary trial failed.  FINDINGS:   Uterine procidentia with traction cystocele, moderate rectocele, atrophic tubes and ovaries   I/O's: Total I/O In: -  Out: 150 [Blood:150] COUNTS:  YES SPECIMENS: Uterus with cervix, bilateral fallopian tubes and ovaries ANTIBIOTIC PROPHYLAXIS:Ancef 2 grams COMPLICATIONS: None immediate  PROCEDURE IN DETAIL: Patient brought to the operating room placed in the supine position. General endotracheal anesthesia was induced without difficulty. She was placed in the dorsal lithotomy position using candycane stirrups. A Betadine perineal intravaginal prep and drape was performed and her fashion. Timeout was completed. Foley catheter was placed. A weighted speculum was placed in the vagina. The uterus was grasped with double-tooth tenaculum. Posterior colpotomy was made with Mayo scissors. Uterosacral ligaments were clamped cut and stick tied using 0 Vicryl suture. These ligaments were tagged.. The cervix was circumscribed using a Bovie cautery. The vagina and bladder were dissected off the lower segment through sharp and blunt dissection. Eventually the anterior cul-de-sac was entered. Sequentially the cardinal/broad ligament complexes were clamped cut and stick tied using 0 Vicryl suture. This was carried out to the level of the uterine cornua. The utero-ovarian ligaments were clamped cut and the specimen (uterus and cervix) was removed from the operative field. The tube and ovary  complexes were identified bilaterally. Curved Heaney clamp was used to cross-clamp the infundibular pelvic ligament. The fallopian tube and ovary were excised sharply. These pedicles were doubly ligated. The first suture was a free tie followed by a stick tie of 0 Vicryl suture  Good hemostasis was noted. The posterior cuff was run using 0 Vicryl suture in a running baseball stitch manner. Peritoneum was reapproximated using a pursestring stitch of 0 Vicryl. The anterior colporrhaphy was then performed in standard manner. The angles of the vaginal mucosa of the bladder were grasped with Allis clamps. The vagina was undermined with Metzenbaum scissors and the vaginal mucosa was incised in the midline. Allis Adair clamps were used to help facilitate exposure. Sequentially this vaginal mucosa was dissected off of the perivesical fascia up to within 3 cm of the urethral meatus. Following dissection of the perivesical fascia off of the vagina the anterior colporrhaphy was completed using 2-0 Vicryl sutures in a vertical mattress technique. Excess vaginal mucosa was excised. The vagina was then reapproximated in the midline using 2-0 chromic suture in simple interrupted manner. Posterior colporrhaphy was then completed. Again Allis clamps were used to identify the lateral margins of the vaginal introitus. A diamond wedge of tissue was excised from the posterior fourchette with a scalpel. The vaginal mucosa was undermined with Metzenbaum scissors and incised in the midline. Allis Adair clamps were used to help with exposure. The perirectal fascia was dissected off the vagina through sharp and blunt dissection. The attenuated levator complexes were then reapproximated in the midline using horizontal mattress sutures of 0 Vicryl. Excess vaginal mucosa was trimmed. The vagina was then reapproximated in the midline using 2-0 chromic simple interrupted sutures. Following completion of the procedures the vagina was packed with  Kerlix gauze and Premarin cream. The patient was then awakened mobilized and taken to recovery  in satisfactory condition.  Emanuell Morina A. Zipporah Plants, MD, A[facility]

## 2017-03-09 NOTE — H&P (View-Only) (Signed)
Subjective:  PREOPERATIVE HISTORY AND PHYSICAL   Date of surgery: 03/09/2017 Procedure: TVH BSO with anterior/posterior colporrhaphy Diagnosis: 1. Procidentia 2. Cystocele, third-degree 3. Rectocele   Patient is a 71 y.o. [id number] scheduled for TVH BSO with anterior/posterior colporrhaphy on 03/09/2017. The patient has symptomatic pelvic organ prolapse.  Status post NSVD 6 Recently evaluated at group clinic for pessary management for symptomatic pelvic organ prolapse without successful fitting of a functional pessary. Patient is not interested in further pessary trials. Patient was told that she needed TVH BSO with a sling procedure for her urinary symptoms and prolapse.  GU history: Frequency-10-20 per day Nocturia-3-4 per night She does report incomplete bladder emptying. She does report significant urgency with loss of urine when unable to get to the bathroom She has not had a trial of unstable bladder medications. She does not report any stress urinary incontinence symptoms. She does not wear any pads Patient does have chronic pelvic pressure with total prolapse of the uterus outside the introitus. GI history: Bowel movement frequency-daily Patient does report mild splinting with bowel movements.  Patient is not currently sexually active because of the previous experience with painful intercourse related to the prolapse  Gynecologic History Patient's last menstrual period was 11/28/2016 (within days). Contraception: post menopausal status   OB History    Gravida Para Term Preterm AB Living   6 6 6     6    SAB TAB Ectopic Multiple Live Births           6      Patient's last menstrual period was 11/28/2016 (within days).    Past Medical History:  Diagnosis Date  . Cystocele, unspecified (CODE)   . High cholesterol   . Hypertension   . 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  TONSILLECTOMY    . TUBAL LIGATION      OB History  Gravida Para Term Preterm AB Living  6 6 6     6   SAB TAB Ectopic Multiple Live Births          6    # Outcome Date GA Lbr Len/2nd Weight Sex Delivery Anes PTL Lv  6 Term 1978   6 lb 8 oz (2.948 kg) F Vag-Spont   LIV  5 Term 1975   6 lb 12.8 oz (3.084 kg) F Vag-Spont   LIV  4 Term 1969   7 lb 4.8 oz (3.311 kg) M Vag-Spont   LIV  3 Term 1965   6 lb (2.722 kg) M Vag-Spont   LIV  2 Term 1964   5 lb 2.1 oz (2.327 kg) F Vag-Spont   LIV  1 Term 1962   6 lb 6.4 oz (2.903 kg) M Vag-Spont   LIV      Social History   Social History  . Marital status: Married    Spouse name: N/A  . Number of children: N/A  . Years of education: N/A   Social History Main Topics  . Smoking status: Former Smoker    Quit date: 1991  . Smokeless tobacco: Never Used  . Alcohol use No  . Drug use: No  . Sexual activity: Not Currently    Birth control/ protection: Surgical   Other Topics Concern  . None   Social History Narrative  . None    Family History  Problem Relation Age of Onset  . Diabetes Mother   . Heart  disease Mother   . Diabetes Sister   . Diabetes Maternal Aunt   . Breast cancer Neg Hx   . Ovarian cancer Neg Hx   . Colon cancer Neg Hx      (Not in a hospital admission)  Allergies  Allergen Reactions  . Penicillins Other (See Comments)    Loses muscle control  . Prednisone Nausea And Vomiting    Loses muscle control    Review of Systems Constitutional: No recent fever/chills/sweats Respiratory: No recent cough/bronchitis Cardiovascular: No chest pain Gastrointestinal: No recent nausea/vomiting/diarrhea Genitourinary: No UTI symptoms Hematologic/lymphatic:No history of coagulopathy or recent blood thinner use    Objective:    BP 125/79   Pulse 70   Ht 5\' 3"  (1.6 m)   Wt 145 lb (65.8 kg)   LMP 11/28/2016 (Within Days) Comment: spotting  BMI 25.69 kg/m   General:   Normal  Skin:   normal  HEENT:  Normal   Neck:  Supple without Adenopathy or Thyromegaly  Lungs:   Heart:              Breasts:   Abdomen:  Pelvis:  M/S   Extremeties:  Neuro:    clear to auscultation bilaterally   Normal without murmur   Not Examined   soft, non-tender; bowel sounds normal; no masses,  no organomegaly   Exam deferred to OR  No CVAT  Warm/Dry   Normal          Assessment:  1. Uterine procidentia 2. Cystocele, third-degree, traction, midline 3. Rectocele, moderate, minimally symptomatic 4. Urge incontinence 5. Incomplete bladder emptying 6. Vaginal atrophy   Plan:  TVH BSO with anterior/posterior colporrhaphy    Preop counseling: The patient is to undergo TVH BSO with anterior/posterior colporrhaphy for management of symptomatic pelvic organ prolapse. She is understanding of the planned procedures and is aware of and is accepting of all surgical risks which include but are not limited to bleeding, infection, pelvic organ injury with need for repair, blood clot disorders, anesthesia risks, etc. Patient does understand that stress incontinence may develop after the surgery which may require a sling in the future. She is accepting of all risks and wishes to proceed with surgery. All questions have been answered.  Brayton Mars, MD  Note: This dictation was prepared with Dragon dictation along with smaller phrase technology. Any transcriptional errors that result from this process are unintentional.

## 2017-03-09 NOTE — Anesthesia Preprocedure Evaluation (Signed)
Anesthesia Evaluation  Patient identified by MRN, date of birth, ID band Patient awake    Reviewed: Allergy & Precautions, NPO status , Patient's Chart, lab work & pertinent test results  History of Anesthesia Complications Negative for: history of anesthetic complications  Airway Mallampati: II       Dental   Pulmonary neg sleep apnea, neg COPD, former smoker,           Cardiovascular hypertension, Pt. on medications and Pt. on home beta blockers (-) Past MI and (-) CHF (-) dysrhythmias (-) Valvular Problems/Murmurs     Neuro/Psych neg Seizures    GI/Hepatic Neg liver ROS, neg GERD  ,  Endo/Other  neg diabetes  Renal/GU Renal InsufficiencyRenal disease     Musculoskeletal   Abdominal   Peds  Hematology   Anesthesia Other Findings   Reproductive/Obstetrics                            Anesthesia Physical Anesthesia Plan  ASA: II  Anesthesia Plan: General   Post-op Pain Management:    Induction: Intravenous  PONV Risk Score and Plan: 3 and Ondansetron, Dexamethasone, Midazolam and Treatment may vary due to age or medical condition  Airway Management Planned: Oral ETT  Additional Equipment:   Intra-op Plan:   Post-operative Plan:   Informed Consent: I have reviewed the patients History and Physical, chart, labs and discussed the procedure including the risks, benefits and alternatives for the proposed anesthesia with the patient or authorized representative who has indicated his/her understanding and acceptance.     Plan Discussed with:   Anesthesia Plan Comments:         Anesthesia Quick Evaluation

## 2017-03-09 NOTE — Transfer of Care (Signed)
Immediate Anesthesia Transfer of Care Note  Patient: Haley Mason  Procedure(s) Performed: Procedure(s): LAPAROSCOPIC ASSISTED VAGINAL HYSTERECTOMY WITH BILATERAL SALPINGO OOPHORECTOMY (Bilateral) ANTERIOR AND POSTERIOR REPAIR WITH SACROSPINOUS FIXATION (N/A)  Patient Location: PACU  Anesthesia Type:General  Level of Consciousness: sedated  Airway & Oxygen Therapy: Patient Spontanous Breathing  Post-op Assessment: Report given to RN and Post -op Vital signs reviewed and stable  Post vital signs: Reviewed and stable  Last Vitals:  Vitals:   03/09/17 1114 03/09/17 1407  BP: (!) 154/62 (!) 126/46  Pulse: (!) 53 (!) 53  Resp: 20 16  Temp: 36.6 C 36.8 C  SpO2: 98% 99%    Last Pain:  Vitals:   03/09/17 1407  TempSrc:   PainSc: 2       Patients Stated Pain Goal: 0 (96/28/36 6294)  Complications: No apparent anesthesia complications

## 2017-03-09 NOTE — Anesthesia Procedure Notes (Signed)
Procedure Name: Intubation Date/Time: 03/09/2017 11:41 AM Performed by: [facility] Pre-anesthesia Checklist: Patient identified, Patient being monitored, Timeout performed, Emergency Drugs available and Suction available Patient Re-evaluated:Patient Re-evaluated prior to induction Oxygen Delivery Method: Circle system utilized Preoxygenation: Pre-oxygenation with 100% oxygen Induction Type: IV induction Ventilation: Mask ventilation without difficulty Laryngoscope Size: Mac and 3 Grade View: Grade I Tube type: Oral Tube size: 7.0 mm Number of attempts: 1 Airway Equipment and Method: Stylet Placement Confirmation: ETT inserted through vocal cords under direct vision,  positive ETCO2 and breath sounds checked- equal and bilateral Secured at: 21 cm Tube secured with: Tape Dental Injury: Teeth and Oropharynx as per pre-operative assessment

## 2017-03-09 NOTE — Interval H&P Note (Signed)
History and Physical Interval Note:  03/09/2017 10:59 AM  Haley Mason  has presented today for surgery, with the diagnosis of URGE INCONTINENCE, PROCIDENTIA OF UTERUS, CYSTOCELE, RECTOCELE, VAGINAL ATROPHY, INCOMPLETE BLADDER EMPTYING  The various methods of treatment have been discussed with the patient and family. After consideration of risks, benefits and other options for treatment, the patient has consented to  Procedure(s): TOTAL  VAGINAL HYSTERECTOMY WITH BILATERAL SALPINGO OOPHORECTOMY (Bilateral) ANTERIOR AND POSTERIOR REPAIR (N/A) as a surgical intervention .  The patient's history has been reviewed, patient examined, no change in status, stable for surgery.  I have reviewed the patient's chart and labs.  Questions were answered to the patient's satisfaction.     Hassell Done A Defrancesco

## 2017-03-10 ENCOUNTER — Encounter: Payer: Self-pay | Admitting: Obstetrics and Gynecology

## 2017-03-10 DIAGNOSIS — N813 Complete uterovaginal prolapse: Secondary | ICD-10-CM | POA: Diagnosis not present

## 2017-03-10 LAB — HEMOGLOBIN: HEMOGLOBIN: 13.5 g/dL (ref 12.0–16.0)

## 2017-03-10 MED ORDER — DOCUSATE SODIUM 100 MG PO CAPS: 100.0000 mg | ORAL_CAPSULE | Freq: Two times a day (BID) | ORAL | 0 refills | Status: AC

## 2017-03-10 MED ORDER — OXYCODONE-ACETAMINOPHEN 5-325 MG PO TABS: 1.0000 | ORAL_TABLET | ORAL | 0 refills | Status: DC | PRN

## 2017-03-10 NOTE — Discharge Summary (Signed)
Physician Discharge Summary  Patient ID: Genell Thede MRN: [id number] DOB/AGE: 1946-03-09 71 y.o.  Admit date: 03/09/2017 Discharge date: 03/10/2017  Admission Diagnoses: Pelvic Organ Prolapse  Discharge Diagnoses:  Pelvic Organ Prolapse  Operative Procedures: Procedure(s): TOTAL VAGINAL HYSTERECTOMY WITH BILATERAL SALPINGO OOPHORECTOMY (Bilateral) ANTERIOR AND POSTERIOR REPAIR  Hospital Course: Uncomplicated.   Significant Diagnostic Studies:  Lab Results  Component Value Date   HGB 13.5 03/10/2017   HGB 15.2 03/05/2017   Lab Results  Component Value Date   HCT 44.5 03/05/2017   CBC Latest Ref Rng & Units 03/10/2017 03/05/2017  WBC 3.6 - 11.0 K/uL - 7.7  Hemoglobin 12.0 - 16.0 g/dL 13.5 15.2  Hematocrit 35.0 - 47.0 % - 44.5  Platelets 150 - 440 K/uL - 223     Discharged Condition: good  Discharge Exam: Blood pressure 125/61, pulse 70, temperature 98.6 F (37 C), temperature source Oral, resp. rate 18, height 5\' 3"  (1.6 m), weight 145 lb (65.8 kg), last menstrual period 11/28/2016, SpO2 97 %. Incision/Wound: no drainage  Disposition: Final discharge disposition not confirmed  Discharge Instructions    Discharge patient    Complete by:  As directed    Discharge disposition:  01-Home or Self Care   Discharge patient date:  03/10/2017     Allergies as of 03/10/2017      Reactions   Penicillins Other (See Comments)   Loses muscle control Has patient had a PCN reaction causing immediate rash, facial/tongue/throat swelling, SOB or lightheadedness with hypotension: No Has patient had a PCN reaction causing severe rash involving mucus membranes or skin necrosis: No Has patient had a PCN reaction that required hospitalization: No Has patient had a PCN reaction occurring within the last 10 years: No If all of the above answers are "NO", then may proceed with Cephalosporin use.   Prednisone Nausea And Vomiting   Loses muscle control      Medication List    STOP  taking these medications   mirabegron ER 25 MG Tb24 tablet Commonly known as:  MYRBETRIQ     TAKE these medications   acetaminophen 325 MG tablet Commonly known as:  TYLENOL Take 650 mg by mouth daily as needed for headache.   atenolol 25 MG tablet Commonly known as:  TENORMIN Take 25 mg by mouth daily.   conjugated estrogens vaginal cream Commonly known as:  PREMARIN Place 0.5 Applicatorfuls vaginally daily. X 30 days then twice weekly What changed:  when to take this  additional instructions   docusate sodium 100 MG capsule Commonly known as:  COLACE Take 1 capsule (100 mg total) by mouth 2 (two) times daily.   MULTI-VITAMINS Tabs Take 1 tablet by mouth daily.   oxyCODONE-acetaminophen 5-325 MG tablet Commonly known as:  PERCOCET/ROXICET Take 1-2 tablets by mouth every 4 (four) hours as needed (moderate to severe pain (when tolerating fluids)).   Vitamin D3 2000 units Tabs Take 2,000 Units by mouth daily.   WELCHOL 625 MG tablet Generic drug:  colesevelam Take 1,875 mg by mouth 2 (two) times daily with a meal. With lunch and supper            Discharge Care Instructions        Start     Ordered   03/10/17 0000  docusate sodium (COLACE) 100 MG capsule  2 times daily     03/10/17 0757   03/10/17 0000  oxyCODONE-acetaminophen (PERCOCET/ROXICET) 5-325 MG tablet  Every 4 hours PRN     03/10/17 0757  03/10/17 0000  Discharge patient    Question Answer Comment  Discharge disposition 01-Home or Self Care   Discharge patient date 03/10/2017      03/10/17 0177     Follow-up Information    Cezar Misiaszek, Alanda Slim, MD. Go in 1 week(s).   Specialties:  Obstetrics and Gynecology, Radiology Why:  Post Op Check Contact information: [address] [address] [phone]           Signed: Alanda Slim Emelio Schneller 03/10/2017, 7:57 AM

## 2017-03-10 NOTE — Progress Notes (Signed)
Patient discharged home with spouse. Discharge instructions, prescriptions and follow up appointment given to and reviewed with patient and spouse. Patient verbalized understanding. Escorted out via wheelchair by auxiliary.

## 2017-03-10 NOTE — Anesthesia Postprocedure Evaluation (Signed)
Anesthesia Post Note  Patient: Haley Mason  Procedure(s) Performed: Procedure(s) (LRB): LAPAROSCOPIC ASSISTED VAGINAL HYSTERECTOMY WITH BILATERAL SALPINGO OOPHORECTOMY (Bilateral) ANTERIOR AND POSTERIOR REPAIR WITH SACROSPINOUS FIXATION (N/A)  Patient location during evaluation: PACU Anesthesia Type: General Level of consciousness: awake and alert Pain management: pain level controlled Vital Signs Assessment: post-procedure vital signs reviewed and stable Respiratory status: spontaneous breathing, nonlabored ventilation, respiratory function stable and patient connected to nasal cannula oxygen Cardiovascular status: blood pressure returned to baseline and stable Postop Assessment: no signs of nausea or vomiting Anesthetic complications: no     Last Vitals:  Vitals:   03/09/17 2337 03/10/17 0425  BP: (!) 145/53 (!) 122/52  Pulse: 77 94  Resp: 18 18  Temp: 37.2 C 36.9 C  SpO2: 95% 96%    Last Pain:  Vitals:   03/10/17 0425  TempSrc: Oral  PainSc:                  Precious Haws Piscitello

## 2017-03-11 LAB — SURGICAL PATHOLOGY

## 2017-03-14 ENCOUNTER — Emergency Department: Payer: Medicare Other

## 2017-03-14 ENCOUNTER — Emergency Department
Admission: EM | Admit: 2017-03-14 | Discharge: 2017-03-14 | Disposition: A | Discharge status: Home / Self Care | Payer: Medicare Other | Attending: Emergency Medicine | Admitting: Emergency Medicine

## 2017-03-14 ENCOUNTER — Encounter: Payer: Self-pay | Admitting: Medical Oncology

## 2017-03-14 DIAGNOSIS — N182 Chronic kidney disease, stage 2 (mild): Secondary | ICD-10-CM | POA: Diagnosis not present

## 2017-03-14 DIAGNOSIS — R079 Chest pain, unspecified: Secondary | ICD-10-CM | POA: Diagnosis present

## 2017-03-14 DIAGNOSIS — Z87891 Personal history of nicotine dependence: Secondary | ICD-10-CM | POA: Insufficient documentation

## 2017-03-14 DIAGNOSIS — Z79899 Other long term (current) drug therapy: Secondary | ICD-10-CM | POA: Insufficient documentation

## 2017-03-14 DIAGNOSIS — R0789 Other chest pain: Secondary | ICD-10-CM | POA: Diagnosis not present

## 2017-03-14 DIAGNOSIS — I129 Hypertensive chronic kidney disease with stage 1 through stage 4 chronic kidney disease, or unspecified chronic kidney disease: Secondary | ICD-10-CM | POA: Insufficient documentation

## 2017-03-14 HISTORY — DX: Disorder of kidney and ureter, unspecified: N28.9

## 2017-03-14 LAB — CBC
HEMATOCRIT: 43.4 % (ref 35.0–47.0)
Hemoglobin: 14.7 g/dL (ref 12.0–16.0)
MCH: 28.9 pg (ref 26.0–34.0)
MCHC: 33.9 g/dL (ref 32.0–36.0)
MCV: 85.3 fL (ref 80.0–100.0)
Platelets: 277 10*3/uL (ref 150–440)
RBC: 5.09 MIL/uL (ref 3.80–5.20)
RDW: 13.8 % (ref 11.5–14.5)
WBC: 9.1 10*3/uL (ref 3.6–11.0)

## 2017-03-14 LAB — BASIC METABOLIC PANEL
ANION GAP: 10 (ref 5–15)
BUN: 11 mg/dL (ref 6–20)
CHLORIDE: 98 mmol/L — AB (ref 101–111)
CO2: 27 mmol/L (ref 22–32)
Calcium: 9.6 mg/dL (ref 8.9–10.3)
Creatinine, Ser: 1.07 mg/dL — ABNORMAL HIGH (ref 0.44–1.00)
GFR calc Af Amer: 58 mL/min — ABNORMAL LOW (ref >60)
GFR calc non Af Amer: 50 mL/min — ABNORMAL LOW (ref >60)
GLUCOSE: 144 mg/dL — AB (ref 65–99)
Potassium: 3.7 mmol/L (ref 3.5–5.1)
Sodium: 135 mmol/L (ref 135–145)

## 2017-03-14 LAB — TROPONIN I
Troponin I: <0.03 ng/mL (ref <0.03)
Troponin I: <0.03 ng/mL (ref <0.03)

## 2017-03-14 MED ORDER — IOPAMIDOL (ISOVUE-370) INJECTION 76%
75.0000 mL | Freq: Once | INTRAVENOUS | Status: AC | PRN
  Administered 2017-03-14: 75 mL via INTRAVENOUS

## 2017-03-14 NOTE — ED Provider Notes (Signed)
[facility] Emergency Department Provider Note   ____________________________________________   First MD Initiated Contact with Patient 03/14/17 [phone]     (approximate)  I have reviewed the triage vital signs and the nursing notes.   HISTORY  Chief Complaint Chest Pain    HPI Haley Mason is a 71 y.o. female who reports she had a vaginal hysterectomy done Monday. She was doing well her little bit of low back pain and went away with Tylenol yesterday today when she was bending over to get close out of the dryer she developed sudden pain in the left upper chest and sometimes radiates to the neck. It is not heavy as more sharp does not seem to be made worse by anything except for palpation of the chest wall. Pain is moderate in nature and began this morning and still present.   Past Medical History:  Diagnosis Date  . Cystocele, unspecified (CODE)   . High cholesterol   . Hypertension   . 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 Renal insufficiency     Patient Active Problem List   Diagnosis Date Noted  . Status post vaginal hysterectomy 03/09/2017  . Urge incontinence 01/14/2017  . Vaginal atrophy 01/14/2017  . Incomplete bladder emptying 01/14/2017    Past Surgical History:  Procedure Laterality Date  . ABDOMINAL HYSTERECTOMY    . ANTERIOR AND POSTERIOR REPAIR WITH SACROSPINOUS FIXATION N/A 03/09/2017   Procedure: ANTERIOR AND POSTERIOR REPAIR WITH SACROSPINOUS FIXATION;  Surgeon: Brayton Mars, MD;  Location: [facility] ORS;  Service: Gynecology;  Laterality: N/A;  . APPENDECTOMY    . LAPAROSCOPIC VAGINAL HYSTERECTOMY WITH SALPINGO OOPHORECTOMY Bilateral 03/09/2017   Procedure: LAPAROSCOPIC ASSISTED VAGINAL HYSTERECTOMY WITH BILATERAL SALPINGO OOPHORECTOMY;  Surgeon: Brayton Mars, MD;  Location: [facility] ORS;  Service: Gynecology;  Laterality: Bilateral;  . TONSILLECTOMY    . TUBAL LIGATION      Prior to Admission  medications   Medication Sig Start Date End Date Taking? Authorizing Provider  acetaminophen (TYLENOL) 325 MG tablet Take 650 mg by mouth daily as needed for headache.    [provider]  atenolol (TENORMIN) 25 MG tablet Take 25 mg by mouth daily.     [provider]  Cholecalciferol (VITAMIN D3) 2000 units TABS Take 2,000 Units by mouth daily.     [provider]  colesevelam (WELCHOL) 625 MG tablet Take 1,875 mg by mouth 2 (two) times daily with a meal. With lunch and supper    [provider]  conjugated estrogens (PREMARIN) vaginal cream Place 0.5 Applicatorfuls vaginally daily. X 30 days then twice weekly Patient taking differently: Place 0.5 Applicatorfuls vaginally 2 (two) times a week. X 30 days then twice weekly 01/14/17   Defrancesco, Alanda Slim, MD  docusate sodium (COLACE) 100 MG capsule Take 1 capsule (100 mg total) by mouth 2 (two) times daily. 03/10/17   Defrancesco, Alanda Slim, MD  Multiple Vitamin (MULTI-VITAMINS) TABS Take 1 tablet by mouth daily.     [provider]  oxyCODONE-acetaminophen (PERCOCET/ROXICET) 5-325 MG tablet Take 1-2 tablets by mouth every 4 (four) hours as needed (moderate to severe pain (when tolerating fluids)). 03/10/17   Defrancesco, Alanda Slim, MD    Allergies Penicillins and Prednisone  Family History  Problem Relation Age of Onset  . Diabetes Mother   . Heart disease Mother   . Diabetes Sister   . Diabetes Maternal Aunt   . Breast cancer Neg Hx   . Ovarian  cancer Neg Hx   . Colon cancer Neg Hx     Social History Social History  Substance Use Topics  . Smoking status: Former Smoker    Quit date: 1991  . Smokeless tobacco: Never Used  . Alcohol use No    Review of Systems  Constitutional: No fever/chills Eyes: No visual changes. ENT: No sore throat. Cardiovascular: See history of present illness. Respiratory: Possible slight shortness of breath. Gastrointestinal: No abdominal pain.  No nausea, no  vomiting.  No diarrhea.  No constipation. Genitourinary: Negative for dysuria. Musculoskeletal: Negative for back pain. Skin: Negative for rash. Neurological: Negative for headaches, focal weakness   ____________________________________________   PHYSICAL EXAM:  VITAL SIGNS: ED Triage Vitals  Enc Vitals Group     BP 03/14/17 0902 (!) 136/48     Pulse Rate 03/14/17 0902 66     Resp 03/14/17 0902 18     Temp 03/14/17 0902 98.2 F (36.8 C)     Temp Source 03/14/17 0902 Oral     SpO2 03/14/17 0902 97 %     Weight 03/14/17 0901 145 lb (65.8 kg)     Height 03/14/17 0901 5\' 3"  (1.6 m)     Head Circumference --      Peak Flow --      Pain Score 03/14/17 0901 3     Pain Loc --      Pain Edu? --      Excl. in [place]? --     Constitutional: Alert and oriented. Well appearing and in no acute distress. Eyes: Conjunctivae are normal.I. Head: Atraumatic. Nose: No congestion/rhinnorhea. Mouth/Throat: Mucous membranes are moist.  Oropharynx non-erythematous. Neck: No stridor.  Cardiovascular: Normal rate, regular rhythm. Grossly normal heart sounds.  Good peripheral circulation. Respiratory: Normal respiratory effort.  No retractions. Lungs CTAB.Chest wall is tender to palpation in the area of the pain. I do not feel any lumps or masses there. Gastrointestinal: Soft and nontender. No distention. No abdominal bruits. No CVA tenderness. Musculoskeletal: No lower extremity tenderness nor edema.  No joint effusions. Neurologic:  Normal speech and language. No gross focal neurologic deficits are appreciated.  Skin:  Skin is warm, dry and intact. No rash noted. Psychiatric: Mood and affect are normal. Speech and behavior are normal.  ____________________________________________   LABS (all labs ordered are listed, but only abnormal results are displayed)  Labs Reviewed  BASIC METABOLIC PANEL - Abnormal; Notable for the following:       Result Value   Chloride 98 (*)    Glucose, Bld 144 (*)     Creatinine, Ser 1.07 (*)    GFR calc non Af Amer 50 (*)    GFR calc Af Amer 58 (*)    All other components within normal limits  CBC  TROPONIN I  TROPONIN I   ____________________________________________  EKG  EKG read and interpreted by me shows normal sinus rhythm at a rate of 70 normal axis no acute ST-T wave changes there is one PVC ____________________________________________  RADIOLOGY  IMPRESSION: 1. Tapering of the peripheral pulmonary vasculature favors emphysema. 2. Mild scarring at the right lung base.   Electronically Signed   By: Van Clines M.D.   On: 03/14/2017 09:29 ____________________________________________   PROCEDURES  Procedure(s) performed:  Procedures  Critical Care performed:  ____________________________________________   INITIAL IMPRESSION / ASSESSMENT AND PLAN / ED COURSE  Pertinent labs & imaging results that were available during my care of the patient were reviewed by me and considered  in my medical decision making (see chart for details).   Patient's chest pain went away when she got the IV dye. Her 2 negative troponins EKG looks okay and we'll have her follow-up with her doctor. I believe it was just chest wall pain.     ____________________________________________   FINAL CLINICAL IMPRESSION(S) / ED DIAGNOSES  Final diagnoses:  Chest wall pain      NEW MEDICATIONS STARTED DURING THIS VISIT:  New Prescriptions   No medications on file     Note:  This document was prepared using Dragon voice recognition software and may include unintentional dictation errors.    Nena Polio, MD 03/14/17 1247

## 2017-03-14 NOTE — ED Notes (Signed)
Assisted patient to the restroom.  

## 2017-03-14 NOTE — ED Notes (Signed)
Epad not working at time of discharge; paper copy signed, labeled and placed in pt chart.

## 2017-03-14 NOTE — Discharge Instructions (Signed)
Please use Tylenol as needed for the pain. Please follow-up with your doctor for the spot that we found on the chest CT. Please return for worse pain fever vomiting or feeling sicker at all.

## 2017-03-14 NOTE — ED Triage Notes (Signed)
Pt reports she had total hysterectomy Monday. This am she was doing laundry and felt a sudden pain to left side of her chest. Pt reports it feels like a "knot" there. Denies sob. Pain radiates into neck. A/O x 4. NAD noted.

## 2017-03-18 ENCOUNTER — Ambulatory Visit (INDEPENDENT_AMBULATORY_CARE_PROVIDER_SITE_OTHER): Payer: Medicare Other | Admitting: Obstetrics and Gynecology

## 2017-03-18 ENCOUNTER — Encounter: Payer: Medicare Other | Admitting: Obstetrics and Gynecology

## 2017-03-18 ENCOUNTER — Encounter: Payer: Self-pay | Admitting: Obstetrics and Gynecology

## 2017-03-18 VITALS — BP 133/78 | HR 80 | Ht 63.0 in | Wt 144.8 lb

## 2017-03-18 DIAGNOSIS — Z90710 Acquired absence of both cervix and uterus: Secondary | ICD-10-CM

## 2017-03-18 DIAGNOSIS — Z09 Encounter for follow-up examination after completed treatment for conditions other than malignant neoplasm: Secondary | ICD-10-CM

## 2017-03-18 NOTE — Progress Notes (Signed)
Chief complaint: 1. One week postop check 2. Status post TVH BSO with anterior/posterior colporrhaphy  Patient presents for her 1 week postop check. Bowel function is normal. Bladder function is notable for some urgency and leakage of urine when she is unable to get to the bathroom quickly enough. She is not experiencing any incontinence with stress. She is not having any pelvic pain and is not taking any pain medications.  Haley Mason was in the hospital this past week when she had chest pain; subsequent workup ruled out PE and MI. She is a symptomatic at this time  Pathology from surgery: DIAGNOSIS:  A. UTERUS WITH CERVIX, BILATERAL FALLOPIAN TUBES AND OVARIES;  HYSTERECTOMY WITH BILATERAL SALPINGO-OOPHORECTOMY:  - SQUAMOUS METAPLASIA AND CHRONIC CERVICITIS.  - PROLIFERATIVE ENDOMETRIUM.  - BENIGN ENDOMETRIAL POLYP, 2.6 CM.  - AGE-RELATED CHANGE, BILATERAL OVARIES.  - BILATERAL FALLOPIAN TUBES WITHOUT PATHOLOGIC CHANGE.    OBJECTIVE: BP 133/78   Pulse 80   Ht 5\' 3"  (1.6 m)   Wt 144 lb 12.8 oz (65.7 kg)   LMP 11/28/2016 (Within Days) Comment: spotting  BMI 25.65 kg/m  Physical exam deferred  ASSESSMENT: 1. One week status post TVH BSO with anterior/posterior colporrhaphy for symptomatic pelvic relaxation-normal 2. Recent chest pain with negative workup for PE and MI 3. Mild urge incontinence symptoms; unwilling to proceed with medical therapy at this time  PLAN: 1. Continue with routine postoperative precautions. 2. Increase activities as tolerated. 3. Return in 5 weeks for final postop check and reassessment of her urge incontinence  Haley Mars, MD  Note: This dictation was prepared with Dragon dictation along with smaller phrase technology. Any transcriptional errors that result from this process are unintentional.

## 2017-03-18 NOTE — Patient Instructions (Signed)
1.  Return in 5 weeks for final postop check 

## 2017-03-24 ENCOUNTER — Ambulatory Visit: Admit: 2017-03-24 | Payer: Medicare Other | Admitting: Obstetrics & Gynecology

## 2017-03-24 SURGERY — HYSTERECTOMY, VAGINAL
Anesthesia: Choice

## 2017-04-22 ENCOUNTER — Encounter: Payer: Self-pay | Admitting: Obstetrics and Gynecology

## 2017-04-22 ENCOUNTER — Ambulatory Visit (INDEPENDENT_AMBULATORY_CARE_PROVIDER_SITE_OTHER): Payer: Medicare Other | Admitting: Obstetrics and Gynecology

## 2017-04-22 VITALS — BP 151/78 | HR 69 | Ht 63.0 in | Wt 144.6 lb

## 2017-04-22 DIAGNOSIS — Z90710 Acquired absence of both cervix and uterus: Secondary | ICD-10-CM

## 2017-04-22 DIAGNOSIS — Z09 Encounter for follow-up examination after completed treatment for conditions other than malignant neoplasm: Secondary | ICD-10-CM

## 2017-04-22 MED ORDER — ESTROGENS, CONJUGATED 0.625 MG/GM VA CREA: 0.5000 | TOPICAL_CREAM | VAGINAL | 3 refills | Status: DC

## 2017-04-22 NOTE — Patient Instructions (Signed)
1.  Recommend Premarin cream 1/2 g intravaginally once or twice a week to help keep the repair healthy 2.  Return in 1 year for follow-up

## 2017-04-22 NOTE — Progress Notes (Signed)
Chief complaint: 1.  Final postop check 2.  Status post TVH BSO with anterior/posterior colporrhaphy 3.  History of urge incontinence 4.  History of incomplete bladder emptying 5.  Vaginal atrophy  Patient presents for her final postop check 6 weeks post surgery.  She is doing very well with normal bowel and bladder function.  She is noting resolution of urgency and she goes to the bathroom at night only once or twice.  She is not having any incontinence except for an occasional dribbling.  She is not experiencing any pelvic pain.  She is very pleased with resolution of her prolapse (procidentia and traction cystocele)  Past medical history, past surgical history, problem list, medications, and allergies are reviewed  OBJECTIVE: BP (!) 151/78   Pulse 69   Ht 5\' 3"  (1.6 m)   Wt 144 lb 9.6 oz (65.6 kg)   LMP 11/28/2016 (Within Days) Comment: spotting  BMI 25.61 kg/m  Pleasant well-appearing female in no acute distress.  She is alert and oriented.  Affect is appropriate. Abdomen: Soft, nontender without organomegaly Pelvic exam: External genitalia-normal BUS-normal Vagina-good vault support; vaginal cuff with minimal postop induration (normal) without significant tenderness; depth of vagina is approximately 6 cm; mild atrophy Cervix-surgically absent Uterus-surgically absent Adnexa-nonpalpable and nontender Rectovaginal-normal external exam  ASSESSMENT: 1.  Normal postop check 6 weeks status post TVH BSO with anterior/posterior colporrhaphy for uterine procidentia and cystocele 2.  Urge incontinence, much improved 3.  Incomplete bladder emptying, resolved 4.  Vaginal atrophy, stable  PLAN: 1.  Resume activities as tolerated 2.  Continue with Premarin cream 1/2 g intravaginally once or twice a week 3.  Return in 1 year for annual exam 4.  Limit heavy lifting as much as possible  Brayton Mars, MD  Note: This dictation was prepared with Dragon dictation along with  smaller phrase technology. Any transcriptional errors that result from this process are unintentional.

## 2017-09-01 ENCOUNTER — Other Ambulatory Visit: Payer: Self-pay | Admitting: Internal Medicine

## 2017-09-01 DIAGNOSIS — Z1231 Encounter for screening mammogram for malignant neoplasm of breast: Secondary | ICD-10-CM

## 2017-10-28 ENCOUNTER — Ambulatory Visit
Admission: RE | Admit: 2017-10-28 | Discharge: 2017-10-28 | Disposition: A | Discharge status: Home / Self Care | Payer: Medicare Other | Source: Ambulatory Visit | Attending: Internal Medicine | Admitting: Internal Medicine

## 2017-10-28 DIAGNOSIS — Z1231 Encounter for screening mammogram for malignant neoplasm of breast: Secondary | ICD-10-CM

## 2018-04-22 ENCOUNTER — Encounter: Payer: Medicare Other | Admitting: Obstetrics and Gynecology

## 2018-10-08 ENCOUNTER — Other Ambulatory Visit: Payer: Self-pay | Admitting: Internal Medicine

## 2018-10-08 DIAGNOSIS — Z1231 Encounter for screening mammogram for malignant neoplasm of breast: Secondary | ICD-10-CM

## 2019-01-06 ENCOUNTER — Other Ambulatory Visit: Payer: Self-pay

## 2019-01-06 ENCOUNTER — Ambulatory Visit
Admission: RE | Admit: 2019-01-06 | Discharge: 2019-01-06 | Disposition: A | Discharge status: Home / Self Care | Payer: Medicare Other | Source: Ambulatory Visit | Attending: Internal Medicine | Admitting: Internal Medicine

## 2019-01-06 DIAGNOSIS — Z1231 Encounter for screening mammogram for malignant neoplasm of breast: Secondary | ICD-10-CM | POA: Insufficient documentation

## 2019-01-26 ENCOUNTER — Other Ambulatory Visit: Payer: Self-pay | Admitting: Internal Medicine

## 2019-01-26 DIAGNOSIS — I272 Pulmonary hypertension, unspecified: Secondary | ICD-10-CM

## 2019-02-02 ENCOUNTER — Ambulatory Visit
Admission: RE | Admit: 2019-02-02 | Discharge: 2019-02-02 | Disposition: A | Discharge status: Home / Self Care | Payer: Medicare Other | Source: Ambulatory Visit | Attending: Internal Medicine | Admitting: Internal Medicine

## 2019-02-02 ENCOUNTER — Other Ambulatory Visit: Payer: Self-pay

## 2019-02-02 DIAGNOSIS — I272 Pulmonary hypertension, unspecified: Secondary | ICD-10-CM | POA: Diagnosis present

## 2019-02-02 MED ORDER — IOHEXOL 350 MG/ML SOLN
75.0000 mL | Freq: Once | INTRAVENOUS | Status: AC | PRN
  Administered 2019-02-02: 14:00:00 75 mL via INTRAVENOUS

## 2019-09-19 ENCOUNTER — Other Ambulatory Visit: Payer: Self-pay | Admitting: Internal Medicine

## 2019-09-19 DIAGNOSIS — Z1231 Encounter for screening mammogram for malignant neoplasm of breast: Secondary | ICD-10-CM

## 2020-01-09 ENCOUNTER — Ambulatory Visit
Admission: RE | Admit: 2020-01-09 | Discharge: 2020-01-09 | Disposition: A | Discharge status: Home / Self Care | Payer: Medicare Other | Source: Ambulatory Visit | Attending: Internal Medicine | Admitting: Internal Medicine

## 2020-01-09 DIAGNOSIS — Z1231 Encounter for screening mammogram for malignant neoplasm of breast: Secondary | ICD-10-CM | POA: Insufficient documentation

## 2020-01-31 LAB — COLOGUARD: COLOGUARD: NEGATIVE

## 2020-01-31 LAB — EXTERNAL GENERIC LAB PROCEDURE: COLOGUARD: NEGATIVE

## 2020-06-13 ENCOUNTER — Other Ambulatory Visit: Payer: Self-pay | Admitting: Internal Medicine

## 2020-06-13 DIAGNOSIS — S32010A Wedge compression fracture of first lumbar vertebra, initial encounter for closed fracture: Secondary | ICD-10-CM

## 2020-06-20 ENCOUNTER — Ambulatory Visit
Admission: RE | Admit: 2020-06-20 | Discharge: 2020-06-20 | Disposition: A | Discharge status: Home / Self Care | Payer: Medicare Other | Source: Ambulatory Visit | Attending: Internal Medicine | Admitting: Internal Medicine

## 2020-06-20 ENCOUNTER — Other Ambulatory Visit: Payer: Self-pay

## 2020-06-20 DIAGNOSIS — S32010A Wedge compression fracture of first lumbar vertebra, initial encounter for closed fracture: Secondary | ICD-10-CM | POA: Diagnosis present

## 2020-09-13 ENCOUNTER — Other Ambulatory Visit: Payer: Self-pay | Admitting: Internal Medicine

## 2020-09-13 DIAGNOSIS — Z1231 Encounter for screening mammogram for malignant neoplasm of breast: Secondary | ICD-10-CM

## 2021-01-09 ENCOUNTER — Other Ambulatory Visit: Payer: Self-pay

## 2021-01-09 ENCOUNTER — Ambulatory Visit
Admission: RE | Admit: 2021-01-09 | Discharge: 2021-01-09 | Disposition: A | Discharge status: Home / Self Care | Payer: Medicare Other | Source: Ambulatory Visit | Attending: Internal Medicine | Admitting: Internal Medicine

## 2021-01-09 DIAGNOSIS — Z1231 Encounter for screening mammogram for malignant neoplasm of breast: Secondary | ICD-10-CM

## 2021-08-15 ENCOUNTER — Other Ambulatory Visit: Payer: Self-pay | Admitting: Internal Medicine

## 2021-08-15 DIAGNOSIS — Z1231 Encounter for screening mammogram for malignant neoplasm of breast: Secondary | ICD-10-CM

## 2022-01-10 ENCOUNTER — Ambulatory Visit
Admission: RE | Admit: 2022-01-10 | Discharge: 2022-01-10 | Disposition: A | Discharge status: Home / Self Care | Payer: Medicare Other | Source: Ambulatory Visit | Attending: Internal Medicine | Admitting: Internal Medicine

## 2022-01-10 DIAGNOSIS — Z1231 Encounter for screening mammogram for malignant neoplasm of breast: Secondary | ICD-10-CM

## 2022-02-25 ENCOUNTER — Other Ambulatory Visit: Payer: Self-pay

## 2022-02-25 ENCOUNTER — Emergency Department
Admission: EM | Admit: 2022-02-25 | Discharge: 2022-02-25 | Disposition: A | Discharge status: Home / Self Care | Payer: Medicare Other | Attending: Emergency Medicine | Admitting: Emergency Medicine

## 2022-02-25 ENCOUNTER — Emergency Department: Payer: Medicare Other

## 2022-02-25 DIAGNOSIS — I129 Hypertensive chronic kidney disease with stage 1 through stage 4 chronic kidney disease, or unspecified chronic kidney disease: Secondary | ICD-10-CM | POA: Insufficient documentation

## 2022-02-25 DIAGNOSIS — N189 Chronic kidney disease, unspecified: Secondary | ICD-10-CM | POA: Insufficient documentation

## 2022-02-25 DIAGNOSIS — R197 Diarrhea, unspecified: Secondary | ICD-10-CM | POA: Diagnosis present

## 2022-02-25 DIAGNOSIS — R1030 Lower abdominal pain, unspecified: Secondary | ICD-10-CM | POA: Diagnosis not present

## 2022-02-25 DIAGNOSIS — E871 Hypo-osmolality and hyponatremia: Secondary | ICD-10-CM | POA: Diagnosis not present

## 2022-02-25 LAB — COMPREHENSIVE METABOLIC PANEL
ALT: 23 U/L (ref 0–44)
AST: 22 U/L (ref 15–41)
Albumin: 4.4 g/dL (ref 3.5–5.0)
Alkaline Phosphatase: 57 U/L (ref 38–126)
Anion gap: 8 (ref 5–15)
BUN: 10 mg/dL (ref 8–23)
CO2: 25 mmol/L (ref 22–32)
Calcium: 9.7 mg/dL (ref 8.9–10.3)
Chloride: 95 mmol/L — ABNORMAL LOW (ref 98–111)
Creatinine, Ser: 0.82 mg/dL (ref 0.44–1.00)
GFR, Estimated: >60 mL/min (ref >60)
Glucose, Bld: 115 mg/dL — ABNORMAL HIGH (ref 70–99)
Potassium: 4.3 mmol/L (ref 3.5–5.1)
Sodium: 128 mmol/L — ABNORMAL LOW (ref 135–145)
Total Bilirubin: 0.7 mg/dL (ref 0.3–1.2)
Total Protein: 7.2 g/dL (ref 6.5–8.1)

## 2022-02-25 LAB — CBC WITH DIFFERENTIAL/PLATELET
Abs Immature Granulocytes: 0.02 10*3/uL (ref 0.00–0.07)
Basophils Absolute: 0 10*3/uL (ref 0.0–0.1)
Basophils Relative: 1 %
Eosinophils Absolute: 0.1 10*3/uL (ref 0.0–0.5)
Eosinophils Relative: 1 %
HCT: 44.7 % (ref 36.0–46.0)
Hemoglobin: 14.6 g/dL (ref 12.0–15.0)
Immature Granulocytes: 0 %
Lymphocytes Relative: 25 %
Lymphs Abs: 1.1 10*3/uL (ref 0.7–4.0)
MCH: 27.8 pg (ref 26.0–34.0)
MCHC: 32.7 g/dL (ref 30.0–36.0)
MCV: 85 fL (ref 80.0–100.0)
Monocytes Absolute: 0.5 10*3/uL (ref 0.1–1.0)
Monocytes Relative: 12 %
Neutro Abs: 2.8 10*3/uL (ref 1.7–7.7)
Neutrophils Relative %: 61 %
Platelets: 276 10*3/uL (ref 150–400)
RBC: 5.26 MIL/uL — ABNORMAL HIGH (ref 3.87–5.11)
RDW: 12.2 % (ref 11.5–15.5)
WBC: 4.5 10*3/uL (ref 4.0–10.5)
nRBC: 0 % (ref 0.0–0.2)

## 2022-02-25 LAB — BASIC METABOLIC PANEL
Anion gap: 10 (ref 5–15)
BUN: 9 mg/dL (ref 8–23)
CO2: 25 mmol/L (ref 22–32)
Calcium: 9.3 mg/dL (ref 8.9–10.3)
Chloride: 98 mmol/L (ref 98–111)
Creatinine, Ser: 0.86 mg/dL (ref 0.44–1.00)
GFR, Estimated: >60 mL/min (ref >60)
Glucose, Bld: 108 mg/dL — ABNORMAL HIGH (ref 70–99)
Potassium: 4.6 mmol/L (ref 3.5–5.1)
Sodium: 133 mmol/L — ABNORMAL LOW (ref 135–145)

## 2022-02-25 LAB — URINALYSIS, COMPLETE (UACMP) WITH MICROSCOPIC
Bacteria, UA: NONE SEEN
Bilirubin Urine: NEGATIVE
Glucose, UA: NEGATIVE mg/dL
Ketones, ur: NEGATIVE mg/dL
Leukocytes,Ua: NEGATIVE
Nitrite: NEGATIVE
Protein, ur: NEGATIVE mg/dL
Specific Gravity, Urine: 1.003 — ABNORMAL LOW (ref 1.005–1.030)
pH: 6 (ref 5.0–8.0)

## 2022-02-25 MED ORDER — LOPERAMIDE HCL 2 MG PO CAPS
2.0000 mg | ORAL_CAPSULE | Freq: Once | ORAL | Status: AC
  Administered 2022-02-25: 2 mg via ORAL
  Filled 2022-02-25: qty 1

## 2022-02-25 MED ORDER — IOHEXOL 300 MG/ML  SOLN
80.0000 mL | Freq: Once | INTRAMUSCULAR | Status: AC | PRN
  Administered 2022-02-25: 80 mL via INTRAVENOUS

## 2022-02-25 MED ORDER — SODIUM CHLORIDE 0.9 % IV BOLUS
1000.0000 mL | Freq: Once | INTRAVENOUS | Status: AC
  Administered 2022-02-25: 1000 mL via INTRAVENOUS

## 2022-02-25 NOTE — ED Provider Notes (Signed)
[facility] Provider Note    Event Date/Time   First MD Initiated Contact with Patient 02/25/22 1151     (approximate)  History   Chief Complaint: Diarrhea  HPI  Haley Mason is a 76 y.o. female with a past medical history of hypertension, hyperlipidemia, CKD, presents to the emergency department for diarrhea on the low salt level.  According to the patient for the past week she has been experiencing diarrhea which she describes as 4-5 loose bowel movements per day.  Denies any nausea or vomiting.  States occasional lower abdominal discomfort but denies any currently.  No fever.  Patient states she saw her doctor and had lab work performed showing a sodium of 127 so the patient was referred to the emergency department for further work-up and treatment.  Here the patient's husband states the patient's sodium is often low at her doctor in the low 130s, but not this low usually.    Physical Exam   Triage Vital Signs: ED Triage Vitals  Enc Vitals Group     BP 02/25/22 1115 (!) 164/76     Pulse Rate 02/25/22 1115 68     Resp 02/25/22 1115 17     Temp 02/25/22 1115 97.9 F (36.6 C)     Temp Source 02/25/22 1115 Oral     SpO2 02/25/22 1115 100 %     Weight 02/25/22 1116 134 lb (60.8 kg)     Height 02/25/22 1116 '5\' 2"'$  (1.575 m)     Head Circumference --      Peak Flow --      Pain Score 02/25/22 1116 0     Pain Loc --      Pain Edu? --      Excl. in [place]? --     Most recent vital signs: Vitals:   02/25/22 1115  BP: (!) 164/76  Pulse: 68  Resp: 17  Temp: 97.9 F (36.6 C)  SpO2: 100%    General: Awake, no distress.  CV:  Good peripheral perfusion.  Regular rate and rhythm  Resp:  Normal effort.  Equal breath sounds bilaterally.  Abd:  No distention.  Soft, nontender.  No rebound or guarding.   ED Results / Procedures / Treatments   RADIOLOGY  I have reviewed and interpreted the CT images.  Patient appears to have some fluid-filled small bowel  loops but otherwise no concerning finding on my evaluation.  Radiology states no acute findings.   MEDICATIONS ORDERED IN ED: Medications  iohexol (OMNIPAQUE) 300 MG/ML solution 80 mL (has no administration in time range)  sodium chloride 0.9 % bolus 1,000 mL (1,000 mLs Intravenous New Bag/Given 02/25/22 1232)     IMPRESSION / MDM / ASSESSMENT AND PLAN / ED COURSE  I reviewed the triage vital signs and the nursing notes.  Patient's presentation is most consistent with acute presentation with potential threat to life or bodily function.  Patient presents to the emergency department for 1 week of diarrhea now with hyponatremia.  Patient states for the past week she has been experiencing 5 or 6 loose bowel movements per day had blood work performed at her doctor and was sent to the emergency department for low sodium.  Patient's sodium today is 128 her baseline sodium appears to be in the low 130s.  Given the patient's persistent diarrhea and intermittent lower abdominal pain although benign abdominal exam currently we will obtain CT imaging to rule out colitis or diverticulitis.  We will place  an IV and hydrate with normal saline.  We will obtain a urinalysis and continue to closely monitor.  Patient and husband agreeable to plan.  Patient's work-up is reassuring.  Chemistry did show mildly low sodium of 128 patient received IV fluids with sodium we will recheck a BMP.  Patient CT scan shows no concerning findings.  I believe the patient would be safe for discharge home and follow-up with her PCP with a trial of antidiarrheal medication and recheck for lab work at the end of this week.  Patient agreeable to plan of care.  Repeat chemistry shows a sodium of 133.  I discussed with the patient increase her oral salt intake use antidiarrheal once or twice daily if needed and to follow-up with her PCP on Monday for recheck of her labs.  Patient agreeable to plan.  FINAL CLINICAL IMPRESSION(S) / ED  DIAGNOSES   Hyponatremia Diarrhea   Note:  This document was prepared using Dragon voice recognition software and may include unintentional dictation errors.   Harvest Dark, MD 02/25/22 1500

## 2022-02-25 NOTE — Discharge Instructions (Signed)
As we discussed please use over-the-counter Imodium as needed for diarrhea as written on the box.  Please increase your dietary salt intake.  Please follow-up with your doctor on Monday to recheck your labs.  Return to the emergency department for any symptom personally concerning to yourself.

## 2022-02-25 NOTE — ED Triage Notes (Signed)
FIRST RN: Pt brought over from [facility] with reports of Na of 127.

## 2022-02-25 NOTE — ED Notes (Signed)
See triage note  Presents with family from [facility] with some weakness and diarrhea  States she had lab work done and her Na was low   Afebrile on arrival

## 2022-02-25 NOTE — ED Triage Notes (Signed)
Pt was seen at [facility] for diarrhea this AM. Pt has been feeling weak over the weekend and has been having diarrhea for 1 week. Perry did lab work and found a low sodium. Pt sent here for further evaluation.

## 2022-11-14 ENCOUNTER — Encounter: Payer: Self-pay | Admitting: Gastroenterology

## 2022-11-17 ENCOUNTER — Encounter
Admission: RE | Disposition: A | Discharge status: Home / Self Care | Payer: Self-pay | Source: Home / Self Care | Attending: Gastroenterology

## 2022-11-17 ENCOUNTER — Ambulatory Visit
Admission: RE | Admit: 2022-11-17 | Discharge: 2022-11-17 | Disposition: A | Discharge status: Home / Self Care | Payer: Medicare Other | Attending: Gastroenterology | Admitting: Gastroenterology

## 2022-11-17 ENCOUNTER — Encounter: Payer: Self-pay | Admitting: Gastroenterology

## 2022-11-17 ENCOUNTER — Ambulatory Visit: Payer: Medicare Other | Admitting: Certified Registered"

## 2022-11-17 ENCOUNTER — Other Ambulatory Visit: Payer: Self-pay

## 2022-11-17 DIAGNOSIS — Z1211 Encounter for screening for malignant neoplasm of colon: Secondary | ICD-10-CM | POA: Insufficient documentation

## 2022-11-17 DIAGNOSIS — E1022 Type 1 diabetes mellitus with diabetic chronic kidney disease: Secondary | ICD-10-CM | POA: Diagnosis not present

## 2022-11-17 DIAGNOSIS — Z9049 Acquired absence of other specified parts of digestive tract: Secondary | ICD-10-CM | POA: Diagnosis not present

## 2022-11-17 DIAGNOSIS — K5730 Diverticulosis of large intestine without perforation or abscess without bleeding: Secondary | ICD-10-CM | POA: Diagnosis not present

## 2022-11-17 DIAGNOSIS — K621 Rectal polyp: Secondary | ICD-10-CM | POA: Diagnosis not present

## 2022-11-17 DIAGNOSIS — Z79899 Other long term (current) drug therapy: Secondary | ICD-10-CM | POA: Insufficient documentation

## 2022-11-17 DIAGNOSIS — K644 Residual hemorrhoidal skin tags: Secondary | ICD-10-CM | POA: Diagnosis not present

## 2022-11-17 DIAGNOSIS — Z9981 Dependence on supplemental oxygen: Secondary | ICD-10-CM | POA: Diagnosis not present

## 2022-11-17 DIAGNOSIS — K56699 Other intestinal obstruction unspecified as to partial versus complete obstruction: Secondary | ICD-10-CM | POA: Insufficient documentation

## 2022-11-17 DIAGNOSIS — N182 Chronic kidney disease, stage 2 (mild): Secondary | ICD-10-CM | POA: Diagnosis not present

## 2022-11-17 DIAGNOSIS — Z87891 Personal history of nicotine dependence: Secondary | ICD-10-CM | POA: Diagnosis not present

## 2022-11-17 DIAGNOSIS — I129 Hypertensive chronic kidney disease with stage 1 through stage 4 chronic kidney disease, or unspecified chronic kidney disease: Secondary | ICD-10-CM | POA: Diagnosis not present

## 2022-11-17 DIAGNOSIS — J449 Chronic obstructive pulmonary disease, unspecified: Secondary | ICD-10-CM | POA: Diagnosis not present

## 2022-11-17 DIAGNOSIS — E7800 Pure hypercholesterolemia, unspecified: Secondary | ICD-10-CM | POA: Insufficient documentation

## 2022-11-17 HISTORY — PX: COLONOSCOPY: SHX5424

## 2022-11-17 SURGERY — COLONOSCOPY
Anesthesia: General

## 2022-11-17 MED ORDER — PROPOFOL 1000 MG/100ML IV EMUL
INTRAVENOUS | Status: AC
  Filled 2022-11-17: qty 100

## 2022-11-17 MED ORDER — PROPOFOL 500 MG/50ML IV EMUL
INTRAVENOUS | Status: DC | PRN
  Administered 2022-11-17: 150 ug/kg/min via INTRAVENOUS

## 2022-11-17 MED ORDER — SODIUM CHLORIDE 0.9 % IV SOLN: INTRAVENOUS | Status: DC

## 2022-11-17 NOTE — H&P (Signed)
Pre-Procedure H&P   Patient ID: Haley Mason is a 77 y.o. female.  Gastroenterology Provider: Jaynie Collins, DO  Referring Provider: Dr. Judithann Sheen PCP: Marguarite Arbour, MD  Date: 11/17/2022  HPI Haley Mason is a 77 y.o. female who presents today for Colonoscopy for Colorectal cancer screening .  Patient undergoing colorectal cancer screening.  Her last colonoscopy was in [place] 10 to 11 years ago which was reportedly normal per patient.  She had a bout of diarrhea last fall with negative CT, negative stool studies.  No melena or hematochezia.  Her bowels have since returned to normal.  She has a bowel movement every 1 to 2 days.  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)    Renal insufficiency     Past Surgical History:  Procedure Laterality Date   ABDOMINAL HYSTERECTOMY     ANTERIOR AND POSTERIOR REPAIR WITH SACROSPINOUS FIXATION N/A 03/09/2017   Procedure: ANTERIOR AND POSTERIOR REPAIR WITH SACROSPINOUS FIXATION;  Surgeon: Herold Harms, MD;  Location: [facility] ORS;  Service: Gynecology;  Laterality: N/A;   APPENDECTOMY     LAPAROSCOPIC VAGINAL HYSTERECTOMY WITH SALPINGO OOPHORECTOMY Bilateral 03/09/2017   Procedure: LAPAROSCOPIC ASSISTED VAGINAL HYSTERECTOMY WITH BILATERAL SALPINGO OOPHORECTOMY;  Surgeon: Herold Harms, MD;  Location: [facility] ORS;  Service: Gynecology;  Laterality: Bilateral;   TONSILLECTOMY     TUBAL LIGATION      Family History No h/o GI disease or malignancy  Review of Systems  Constitutional:  Negative for activity change, appetite change, chills, diaphoresis, fatigue, fever and unexpected weight change.  HENT:  Negative for trouble swallowing and  voice change.   Respiratory:  Negative for shortness of breath and wheezing.   Cardiovascular:  Negative for chest pain, palpitations and leg swelling.  Gastrointestinal:  Negative for abdominal distention, abdominal pain, anal bleeding, blood in stool, constipation, diarrhea, nausea, rectal pain and vomiting.  Musculoskeletal:  Negative for arthralgias and myalgias.  Skin:  Negative for color change and pallor.  Neurological:  Negative for dizziness, syncope and weakness.  Psychiatric/Behavioral:  Negative for confusion.   All other systems reviewed and are negative.    Medications No current facility-administered medications on file prior to encounter.   Current Outpatient Medications on File Prior to Encounter  Medication Sig Dispense Refill   atenolol (TENORMIN) 25 MG tablet Take 25 mg by mouth daily.      Cholecalciferol (VITAMIN D3) 2000 units TABS Take 2,000 Units by mouth daily.      colesevelam (WELCHOL) 625 MG tablet Take 1,875 mg by mouth 2 (two) times daily with a meal. With lunch and supper     conjugated estrogens (PREMARIN) vaginal cream Place 0.5 Applicatorfuls vaginally 2 (two) times a week. 30 g 3   docusate sodium (COLACE) 100 MG capsule Take 1 capsule (100 mg total) by mouth 2 (two) times daily. 10 capsule 0   Multiple Vitamin (MULTI-VITAMINS) TABS Take by mouth.      Pertinent medications related to GI and procedure were reviewed by me with the patient prior to the procedure   Current Facility-Administered Medications:    0.9 %  sodium chloride infusion, , Intravenous, Continuous, Jaynie Collins, DO, Last Rate: 20 mL/hr  at 11/17/22 1202, New Bag at 11/17/22 1202  sodium chloride 20 mL/hr at 11/17/22 1202       Allergies  Allergen Reactions   Penicillins Other (See Comments)    Loses muscle control Has patient had a PCN reaction causing immediate rash, facial/tongue/throat swelling, SOB or lightheadedness with hypotension: No Has patient had a PCN  reaction causing severe rash involving mucus membranes or skin necrosis: No Has patient had a PCN reaction that required hospitalization: No Has patient had a PCN reaction occurring within the last 10 years: No If all of the above answers are "NO", then may proceed with Cephalosporin use.    Prednisone Nausea And Vomiting    Loses muscle control   Allergies were reviewed by me prior to the procedure  Objective   Body mass index is 24.4 kg/m. Vitals:   11/17/22 1156  BP: (!) 143/58  Pulse: 67  Resp: 18  Temp: (!) 97.2 F (36.2 C)  TempSrc: Temporal  SpO2: 98%  Weight: 60.5 kg  Height: 5\' 2"  (1.575 m)     Physical Exam Vitals and nursing note reviewed.  Constitutional:      General: She is not in acute distress.    Appearance: Normal appearance. She is not ill-appearing, toxic-appearing or diaphoretic.  HENT:     Head: Normocephalic and atraumatic.     Nose: Nose normal.     Mouth/Throat:     Mouth: Mucous membranes are moist.     Pharynx: Oropharynx is clear.  Eyes:     General: No scleral icterus.    Extraocular Movements: Extraocular movements intact.  Cardiovascular:     Rate and Rhythm: Normal rate and regular rhythm.     Heart sounds: Normal heart sounds. No murmur heard.    No friction rub. No gallop.  Pulmonary:     Effort: Pulmonary effort is normal. No respiratory distress.     Breath sounds: Normal breath sounds. No wheezing, rhonchi or rales.  Abdominal:     General: Bowel sounds are normal. There is no distension.     Palpations: Abdomen is soft.     Tenderness: There is no abdominal tenderness. There is no guarding or rebound.  Musculoskeletal:     Cervical back: Neck supple.     Right lower leg: No edema.     Left lower leg: No edema.  Skin:    General: Skin is warm and dry.     Coloration: Skin is not jaundiced or pale.  Neurological:     General: No focal deficit present.     Mental Status: She is alert and oriented to person, place, and  time. Mental status is at baseline.  Psychiatric:        Mood and Affect: Mood normal.        Behavior: Behavior normal.        Thought Content: Thought content normal.        Judgment: Judgment normal.      Assessment:  Haley Mason is a 77 y.o. female  who presents today for Colonoscopy for Colorectal cancer screening .  Plan:  Colonoscopy with possible intervention today  Colonoscopy with possible biopsy, control of bleeding, polypectomy, and interventions as necessary has been discussed with the patient/patient representative. Informed consent was obtained from the patient/patient representative after explaining the indication, nature, and risks of the procedure including but not limited to death, bleeding, perforation, missed neoplasm/lesions, cardiorespiratory compromise, and reaction to medications. Opportunity for questions was given and  appropriate answers were provided. Patient/patient representative has verbalized understanding is amenable to undergoing the procedure.   Jaynie Collins, DO  [facility] Gastroenterology  Portions of the record may have been created with voice recognition software. Occasional wrong-word or 'sound-a-like' substitutions may have occurred due to the inherent limitations of voice recognition software.  Read the chart carefully and recognize, using context, where substitutions may have occurred.

## 2022-11-17 NOTE — Transfer of Care (Signed)
Immediate Anesthesia Transfer of Care Note  Patient: Haley Mason  Procedure(s) Performed: COLONOSCOPY  Patient Location: PACU  Anesthesia Type:General  Level of Consciousness: awake and alert   Airway & Oxygen Therapy: Patient Spontanous Breathing and Patient connected to nasal cannula oxygen  Post-op Assessment: Report given to RN and Post -op Vital signs reviewed and stable  Post vital signs: Reviewed and stable  Last Vitals:  Vitals Value Taken Time  BP    Temp    Pulse    Resp    SpO2      Last Pain:  Vitals:   11/17/22 1156  TempSrc: Temporal  PainSc: 0-No pain         Complications: No notable events documented.

## 2022-11-17 NOTE — Op Note (Signed)
[facility] Gastroenterology Patient Name: Haley Mason Procedure Date: 11/17/2022 12:14 PM MRN: [id number] Account #: [id number] Date of Birth: 12-04-1945 Admit Type: Outpatient Age: 77 Room: [facility] ENDO ROOM 2 Gender: Female Note Status: Finalized Instrument Name: Peds Colonoscope [id number] Procedure:             Colonoscopy Indications:           Screening for colorectal malignant neoplasm Providers:             Trenda Moots, DO Referring MD:          Duane Lope. Judithann Sheen, MD (Referring MD) Medicines:             Monitored Anesthesia Care Complications:         No immediate complications. Estimated blood loss:                         Minimal. Procedure:             Pre-Anesthesia Assessment:                        - Prior to the procedure, a History and Physical was                         performed, and patient medications and allergies were                         reviewed. The patient is competent. The risks and                         benefits of the procedure and the sedation options and                         risks were discussed with the patient. All questions                         were answered and informed consent was obtained.                         Patient identification and proposed procedure were                         verified by the physician, the nurse, the anesthetist                         and the technician in the endoscopy suite. Mental                         Status Examination: alert and oriented. Airway                         Examination: normal oropharyngeal airway and neck                         mobility. Respiratory Examination: clear to                         auscultation. CV Examination: RRR, no murmurs, no S3  or S4. Prophylactic Antibiotics: The patient does not                         require prophylactic antibiotics. Prior                         Anticoagulants: The patient has taken no  anticoagulant                         or antiplatelet agents. ASA Grade Assessment: III - A                         patient with severe systemic disease. After reviewing                         the risks and benefits, the patient was deemed in                         satisfactory condition to undergo the procedure. The                         anesthesia plan was to use monitored anesthesia care                         (MAC). Immediately prior to administration of                         medications, the patient was re-assessed for adequacy                         to receive sedatives. The heart rate, respiratory                         rate, oxygen saturations, blood pressure, adequacy of                         pulmonary ventilation, and response to care were                         monitored throughout the procedure. The physical                         status of the patient was re-assessed after the                         procedure.                        After obtaining informed consent, the colonoscope was                         passed under direct vision. Throughout the procedure,                         the patient's blood pressure, pulse, and oxygen                         saturations were monitored continuously. The  Colonoscope was introduced through the anus with the                         intention of advancing to the cecum. The scope was                         advanced to the sigmoid colon before the procedure was                         aborted. Medications were given. The colonoscopy was                         unusually difficult due to multiple diverticula in the                         colon and restricted mobility of the colon. Successful                         completion of the procedure was aided by changing the                         patient to a supine position, changing the patient to                         a prone position, withdrawing  the scope and replacing                         with the adult endoscope, applying abdominal pressure                         and lavage. The patient tolerated the procedure well.                         The quality of the bowel preparation was good. The                         rectum was photographed. Findings:      Skin tags were found on perianal exam.      The digital rectal exam was normal. Pertinent negatives include normal       sphincter tone.      Multiple small-mouthed diverticula were found in the recto-sigmoid colon       and sigmoid colon. Estimated blood loss: none. These in addition to       restricted movement of the colon made it not possible to traverse, even       with the adult gastroscope and despite manuevers.      A benign-appearing, intrinsic severe stenosis measuring of unknown       length x unknown as luminal opening did not appear was found in the       sigmoid colon and was non-traversed. Estimated blood loss: none.      A 1 to 2 mm polyp was found in the rectum. The polyp was sessile. The       polyp was removed with a jumbo cold forceps. Resection and retrieval       were complete. Estimated blood loss was minimal.      In total, only able to advance to ~  30cm from the anus. Estimated blood       loss: none. Impression:            - Perianal skin tags found on perianal exam.                        - Diverticulosis in the recto-sigmoid colon and in the                         sigmoid colon.                        - Stricture in the sigmoid colon.                        - One 1 to 2 mm polyp in the rectum, removed with a                         jumbo cold forceps. Resected and retrieved. Recommendation:        - Patient has a contact number available for                         emergencies. The signs and symptoms of potential                         delayed complications were discussed with the patient.                         Return to normal activities  tomorrow. Written                         discharge instructions were provided to the patient.                        - Discharge patient to home.                        - Resume previous diet.                        - Continue present medications.                        - Await pathology results.                        - Recommend CT colonography.                        Further recommendations pending CT findings.                        - Return to GI office as previously scheduled.                        - The findings and recommendations were discussed with                         the patient. Procedure Code(s):     --- Professional ---  09811, 52, Colonoscopy, flexible; with biopsy, single                         or multiple Diagnosis Code(s):     --- Professional ---                        Z12.11, Encounter for screening for malignant neoplasm                         of colon                        K56.699, Other intestinal obstruction unspecified as                         to partial versus complete obstruction                        D12.8, Benign neoplasm of rectum                        K64.4, Residual hemorrhoidal skin tags                        K57.30, Diverticulosis of large intestine without                         perforation or abscess without bleeding CPT copyright 2022 American Medical Association. All rights reserved. The codes documented in this report are preliminary and upon coder review may  be revised to meet current compliance requirements. Attending Participation:      I personally performed the entire procedure. Elfredia Nevins, DO Jaynie Collins DO, DO 11/17/2022 12:51:47 PM This report has been signed electronically. Number of Addenda: 0 Note Initiated On: 11/17/2022 12:14 PM Total Procedure Duration: 0 hours 24 minutes 26 seconds  Estimated Blood Loss:  Estimated blood loss was minimal.      [facility]

## 2022-11-17 NOTE — Brief Op Note (Signed)
MD unable to pass through sigmoid due to spasms and diverticular stricture.

## 2022-11-17 NOTE — Anesthesia Preprocedure Evaluation (Signed)
Anesthesia Evaluation  Patient identified by MRN, date of birth, ID band Patient awake    Reviewed: Allergy & Precautions, H&P , NPO status , Patient's Chart, lab work & pertinent test results, reviewed documented beta blocker date and time   Airway Mallampati: II   Neck ROM: full    Dental  (+) Poor Dentition   Pulmonary shortness of breath and Long-Term Oxygen Therapy, COPD,  COPD inhaler and oxygen dependent, former smoker   Pulmonary exam normal        Cardiovascular Exercise Tolerance: Poor hypertension, On Medications negative cardio ROS Normal cardiovascular exam Rhythm:regular Rate:Normal     Neuro/Psych negative neurological ROS  negative psych ROS   GI/Hepatic negative GI ROS, Neg liver ROS,,,  Endo/Other  diabetes, Type 1, Insulin Dependent    Renal/GU Renal disease  negative genitourinary   Musculoskeletal   Abdominal   Peds  Hematology negative hematology ROS (+)   Anesthesia Other Findings Past Medical History: No date: Cystocele, unspecified (CODE) No date: High cholesterol No date: Hypertension No date: Kidney disease, chronic, 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) No date: Renal insufficiency Past Surgical History: No date: ABDOMINAL HYSTERECTOMY 03/09/2017: ANTERIOR AND POSTERIOR REPAIR WITH SACROSPINOUS FIXATION;  N/A     Comment:  Procedure: ANTERIOR AND POSTERIOR REPAIR WITH               SACROSPINOUS FIXATION;  Surgeon: Herold Harms,               MD;  Location: [facility] ORS;  Service: Gynecology;                Laterality: N/A; No date: APPENDECTOMY 03/09/2017: LAPAROSCOPIC VAGINAL HYSTERECTOMY WITH SALPINGO  OOPHORECTOMY; Bilateral     Comment:  Procedure: LAPAROSCOPIC ASSISTED VAGINAL HYSTERECTOMY               WITH BILATERAL SALPINGO OOPHORECTOMY;  Surgeon:               Herold Harms, MD;  Location: [facility] ORS;  Service:              Gynecology;  Laterality: Bilateral; No  date: TONSILLECTOMY No date: TUBAL LIGATION BMI    Body Mass Index: 24.40 kg/m     Reproductive/Obstetrics negative OB ROS                             Anesthesia Physical Anesthesia Plan  ASA: 3  Anesthesia Plan: General   Post-op Pain Management:    Induction:   PONV Risk Score and Plan:   Airway Management Planned:   Additional Equipment:   Intra-op Plan:   Post-operative Plan:   Informed Consent: I have reviewed the patients History and Physical, chart, labs and discussed the procedure including the risks, benefits and alternatives for the proposed anesthesia with the patient or authorized representative who has indicated his/her understanding and acceptance.     Dental Advisory Given  Plan Discussed with: CRNA  Anesthesia Plan Comments:        Anesthesia Quick Evaluation

## 2022-11-17 NOTE — Interval H&P Note (Signed)
History and Physical Interval Note: Preprocedure H&P from 11/17/22  was reviewed and there was no interval change after seeing and examining the patient.  Written consent was obtained from the patient after discussion of risks, benefits, and alternatives. Patient has consented to proceed with Colonoscopy with possible intervention   11/17/2022 12:11 PM  Haley Mason  has presented today for surgery, with the diagnosis of Screen for colon cancer (Z12.11).  The various methods of treatment have been discussed with the patient and family. After consideration of risks, benefits and other options for treatment, the patient has consented to  Procedure(s): COLONOSCOPY (N/A) as a surgical intervention.  The patient's history has been reviewed, patient examined, no change in status, stable for surgery.  I have reviewed the patient's chart and labs.  Questions were answered to the patient's satisfaction.     Jaynie Collins

## 2022-11-18 ENCOUNTER — Encounter: Payer: Self-pay | Admitting: Gastroenterology

## 2022-11-18 NOTE — Anesthesia Postprocedure Evaluation (Signed)
Anesthesia Post Note  Patient: Haley Mason  Procedure(s) Performed: COLONOSCOPY  Patient location during evaluation: PACU Anesthesia Type: General Level of consciousness: awake and alert Pain management: pain level controlled Vital Signs Assessment: post-procedure vital signs reviewed and stable Respiratory status: spontaneous breathing, nonlabored ventilation, respiratory function stable and patient connected to nasal cannula oxygen Cardiovascular status: blood pressure returned to baseline and stable Postop Assessment: no apparent nausea or vomiting Anesthetic complications: no   No notable events documented.   Last Vitals:  Vitals:   11/17/22 1251 11/17/22 1301  BP: 118/65 (!) 87/70  Pulse: 66 68  Resp: 14 20  Temp: (!) 36.1 C   SpO2: 97% 99%    Last Pain:  Vitals:   11/18/22 0733  TempSrc:   PainSc: 0-No pain                 Yevette Edwards

## 2022-11-20 ENCOUNTER — Other Ambulatory Visit: Payer: Self-pay | Admitting: Gastroenterology

## 2022-11-20 DIAGNOSIS — K56699 Other intestinal obstruction unspecified as to partial versus complete obstruction: Secondary | ICD-10-CM

## 2022-11-20 DIAGNOSIS — K5730 Diverticulosis of large intestine without perforation or abscess without bleeding: Secondary | ICD-10-CM

## 2022-11-20 DIAGNOSIS — Z1211 Encounter for screening for malignant neoplasm of colon: Secondary | ICD-10-CM

## 2022-11-20 LAB — SURGICAL PATHOLOGY

## 2022-12-15 ENCOUNTER — Other Ambulatory Visit: Payer: Self-pay | Admitting: Internal Medicine

## 2022-12-15 DIAGNOSIS — Z1231 Encounter for screening mammogram for malignant neoplasm of breast: Secondary | ICD-10-CM

## 2023-01-13 ENCOUNTER — Ambulatory Visit
Admission: RE | Admit: 2023-01-13 | Discharge: 2023-01-13 | Disposition: A | Discharge status: Home / Self Care | Payer: Medicare Other | Source: Ambulatory Visit | Attending: Internal Medicine | Admitting: Internal Medicine

## 2023-01-13 DIAGNOSIS — Z1231 Encounter for screening mammogram for malignant neoplasm of breast: Secondary | ICD-10-CM | POA: Insufficient documentation

## 2023-02-17 ENCOUNTER — Other Ambulatory Visit: Payer: Self-pay | Admitting: Internal Medicine

## 2023-02-17 DIAGNOSIS — I10 Essential (primary) hypertension: Secondary | ICD-10-CM

## 2023-02-17 DIAGNOSIS — H539 Unspecified visual disturbance: Secondary | ICD-10-CM

## 2023-02-18 ENCOUNTER — Ambulatory Visit
Admission: RE | Admit: 2023-02-18 | Discharge: 2023-02-18 | Disposition: A | Discharge status: Home / Self Care | Payer: Medicare Other | Source: Ambulatory Visit | Attending: Internal Medicine | Admitting: Internal Medicine

## 2023-02-18 DIAGNOSIS — H539 Unspecified visual disturbance: Secondary | ICD-10-CM

## 2023-02-18 DIAGNOSIS — I10 Essential (primary) hypertension: Secondary | ICD-10-CM

## 2023-06-05 IMAGING — MG MM DIGITAL SCREENING BILAT W/ TOMO AND CAD
8 series · 8 of 24 positions shown · non-contrast
Comparison: Previous exam(s).

CLINICAL DATA: Screening.

EXAM:
DIGITAL SCREENING BILATERAL MAMMOGRAM WITH TOMOSYNTHESIS AND CAD
TECHNIQUE: Bilateral screening digital craniocaudal and mediolateral oblique
mammograms were obtained. Bilateral screening digital breast
tomosynthesis was performed. The images were evaluated with
computer-aided detection.

[L CC synth-2D]
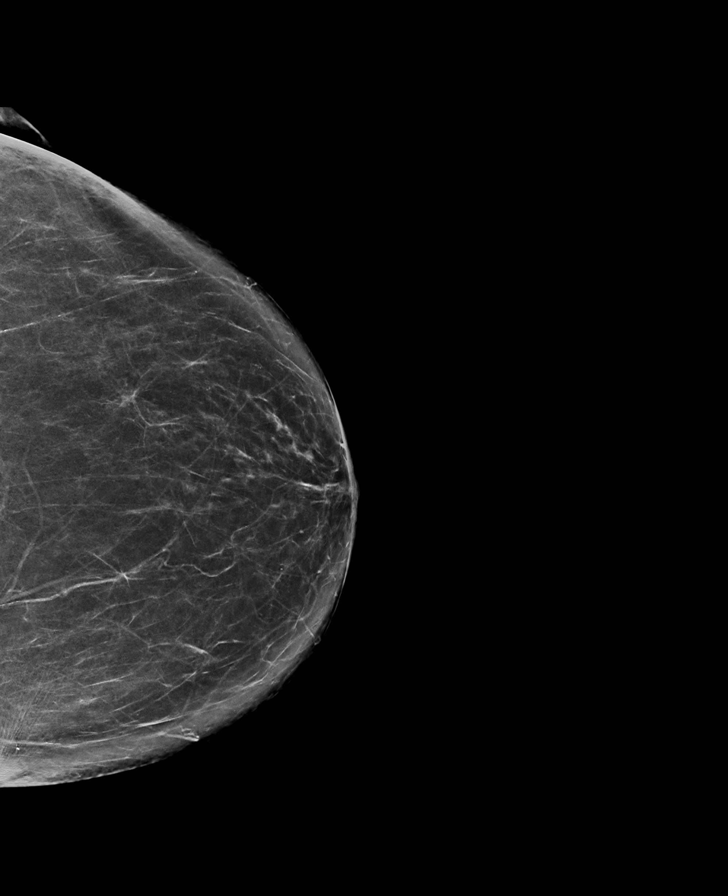

[R CC synth-2D]
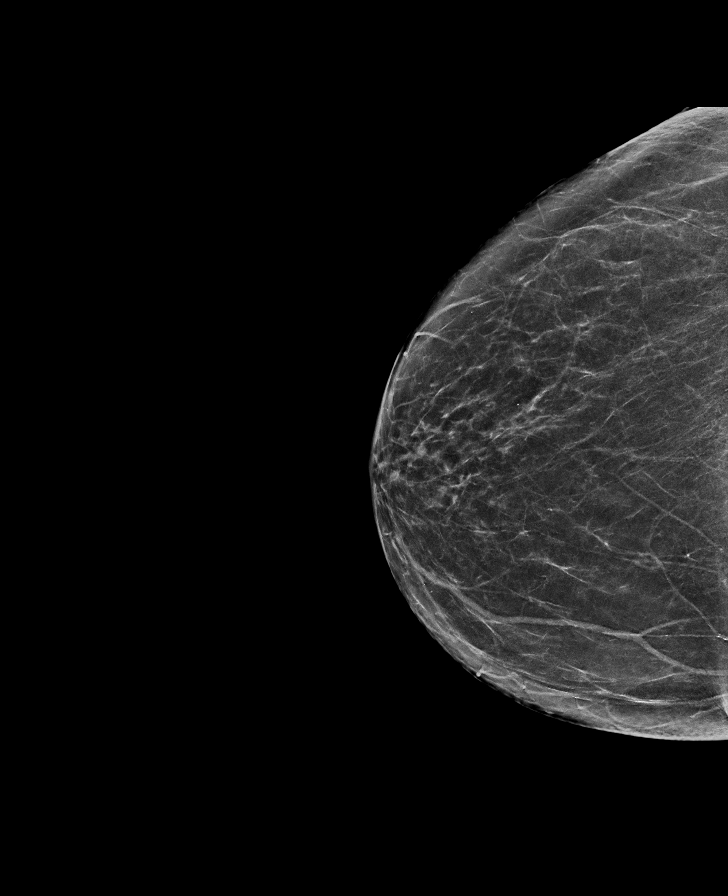

[L MLO synth-2D]
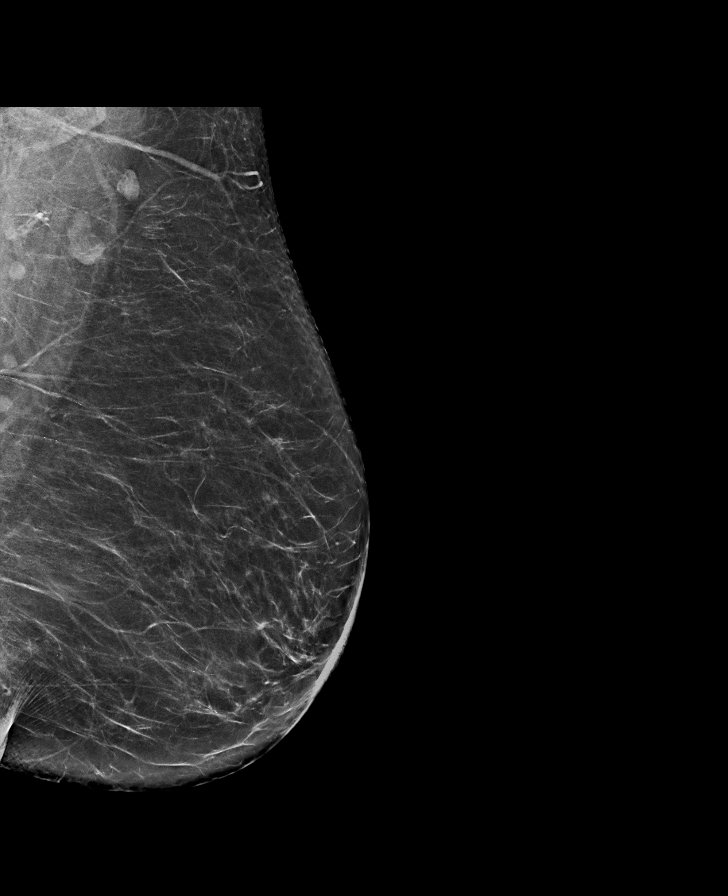

[R MLO synth-2D]
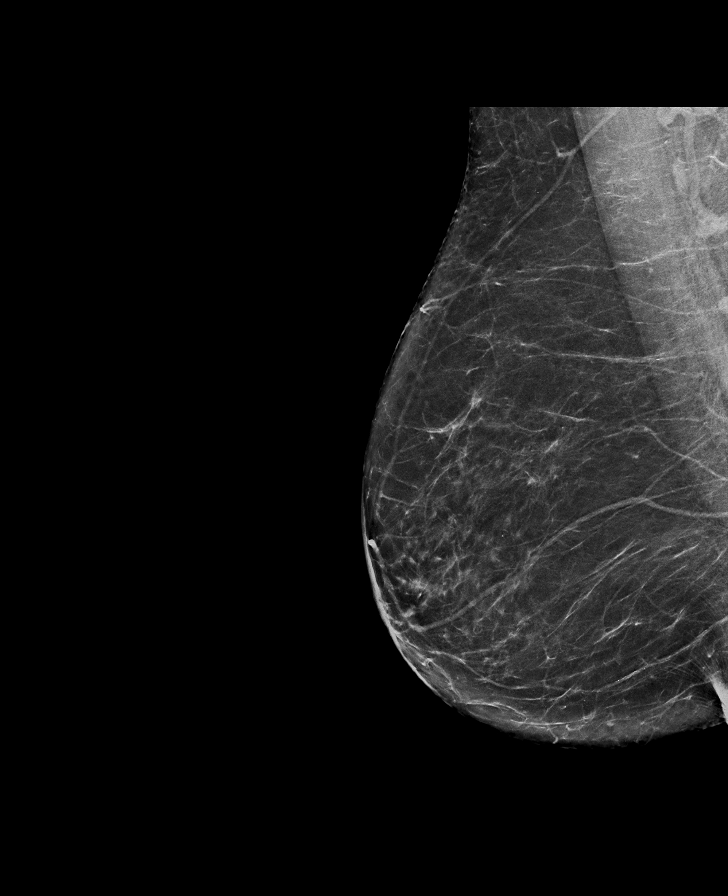

[R MLO tomo · tomo slice 37/72.0]
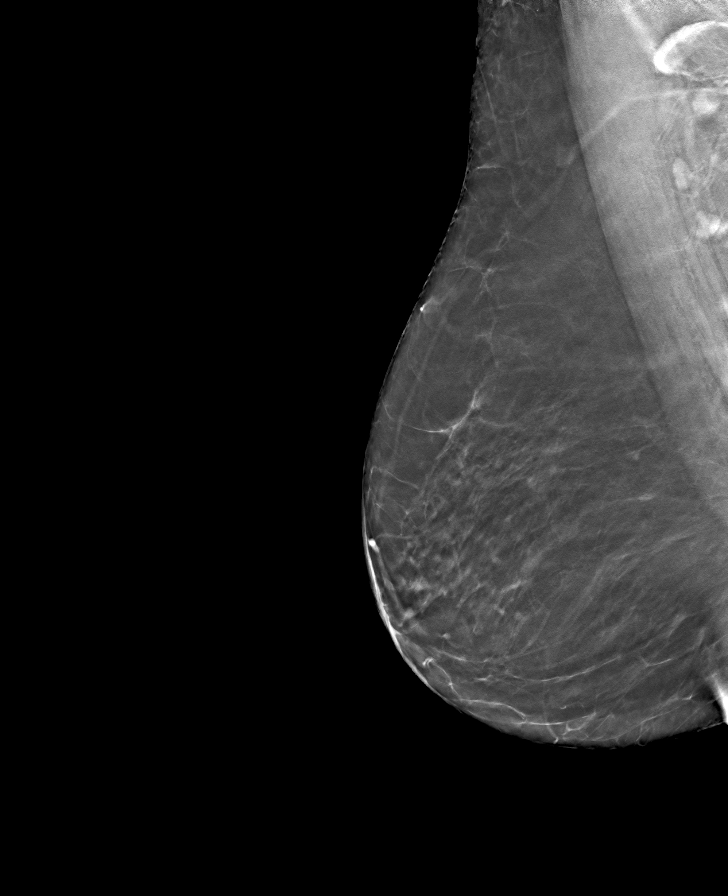

[R CC tomo · tomo slice 33/66.0]
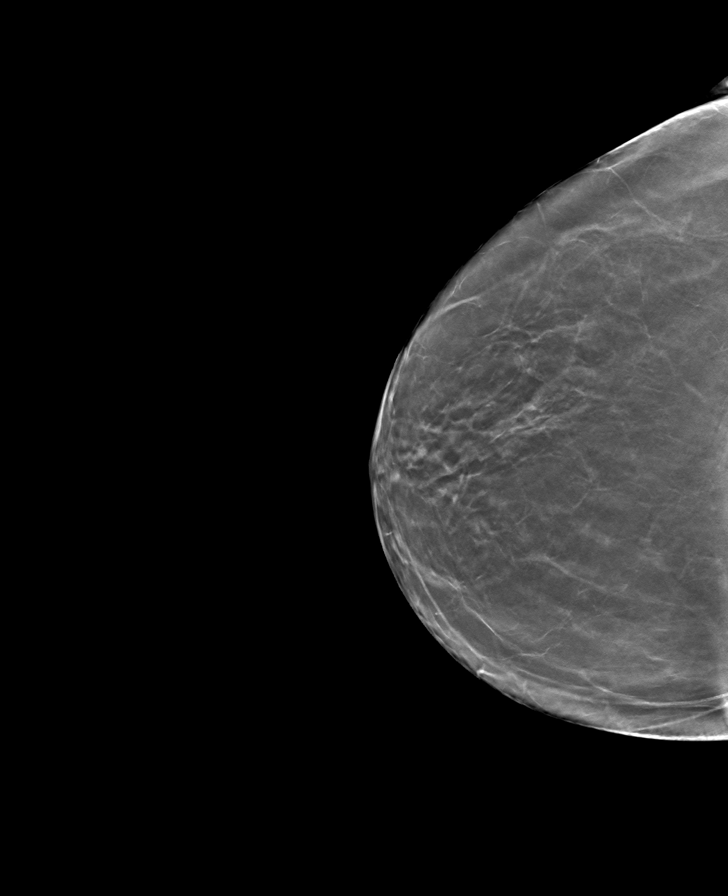

[L CC tomo · tomo slice 36/71.0]
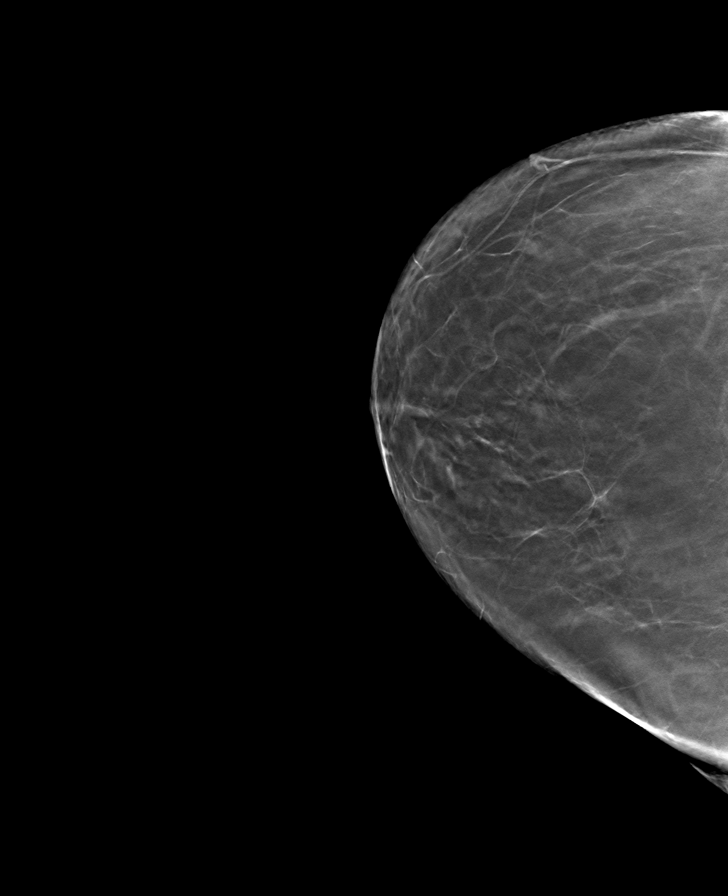

[L MLO tomo · tomo slice 37/73.0]
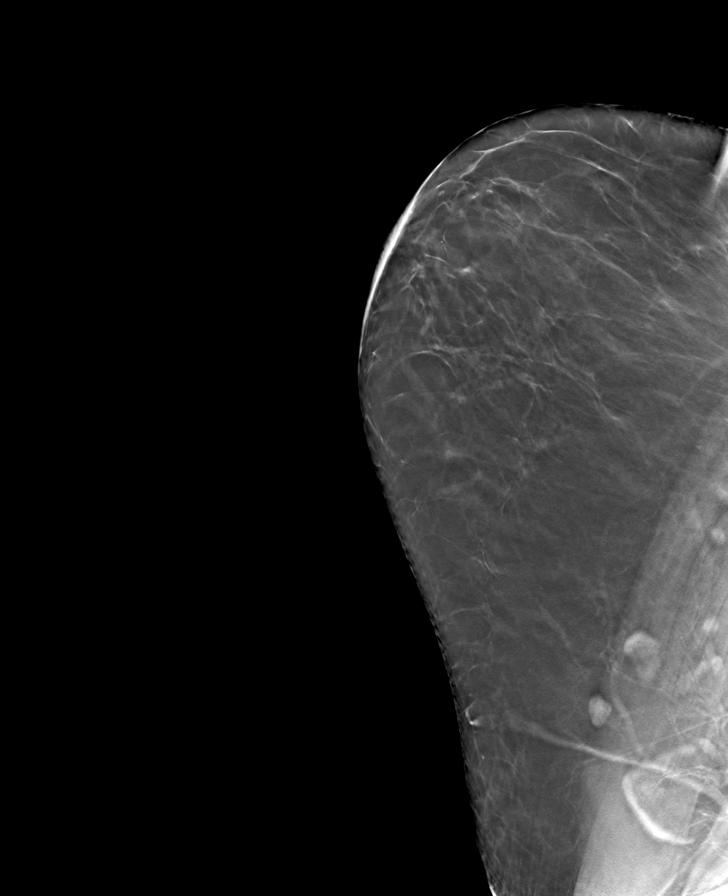

[8 of 24 positions shown; findings below may reference images not displayed]

ACR Breast Density Category b: There are scattered areas of
fibroglandular density.
FINDINGS: There are no findings suspicious for malignancy.
IMPRESSION: No mammographic evidence of malignancy. A result letter of this
screening mammogram will be mailed directly to the patient.

RECOMMENDATION:
Screening mammogram in one year. (Code:51-O-LD2)

BI-RADS CATEGORY  1: Negative.

## 2023-06-18 ENCOUNTER — Other Ambulatory Visit
Admission: RE | Admit: 2023-06-18 | Discharge: 2023-06-18 | Disposition: A | Discharge status: Home / Self Care | Payer: Medicare Other | Source: Ambulatory Visit | Attending: Specialist | Admitting: Specialist

## 2023-06-18 DIAGNOSIS — I272 Pulmonary hypertension, unspecified: Secondary | ICD-10-CM | POA: Diagnosis present

## 2023-06-18 LAB — BRAIN NATRIURETIC PEPTIDE: B Natriuretic Peptide: 87.2 pg/mL (ref 0.0–100.0)

## 2023-09-02 ENCOUNTER — Other Ambulatory Visit
Admission: RE | Admit: 2023-09-02 | Discharge: 2023-09-02 | Disposition: A | Discharge status: Home / Self Care | Source: Ambulatory Visit | Attending: Physician Assistant | Admitting: Physician Assistant

## 2023-09-02 DIAGNOSIS — R0602 Shortness of breath: Secondary | ICD-10-CM | POA: Insufficient documentation

## 2023-09-02 LAB — D-DIMER, QUANTITATIVE: D-Dimer, Quant: 0.32 ug{FEU}/mL (ref 0.00–0.50)

## 2023-09-11 ENCOUNTER — Ambulatory Visit: Attending: Cardiovascular Disease | Admitting: Cardiovascular Disease

## 2023-09-11 ENCOUNTER — Encounter: Payer: Self-pay | Admitting: Cardiovascular Disease

## 2023-09-11 VITALS — BP 140/64 | HR 78 | Ht 62.0 in | Wt 131.2 lb

## 2023-09-11 DIAGNOSIS — E871 Hypo-osmolality and hyponatremia: Secondary | ICD-10-CM | POA: Insufficient documentation

## 2023-09-11 DIAGNOSIS — I272 Pulmonary hypertension, unspecified: Secondary | ICD-10-CM | POA: Insufficient documentation

## 2023-09-11 DIAGNOSIS — Z87891 Personal history of nicotine dependence: Secondary | ICD-10-CM | POA: Diagnosis present

## 2023-09-11 DIAGNOSIS — J432 Centrilobular emphysema: Secondary | ICD-10-CM | POA: Diagnosis present

## 2023-09-11 DIAGNOSIS — R0602 Shortness of breath: Secondary | ICD-10-CM | POA: Diagnosis not present

## 2023-09-11 MED ORDER — POTASSIUM CHLORIDE CRYS ER 10 MEQ PO TBCR: 10.0000 meq | EXTENDED_RELEASE_TABLET | Freq: Every day | ORAL | 3 refills | Status: DC

## 2023-09-11 MED ORDER — POTASSIUM CHLORIDE CRYS ER 20 MEQ PO TBCR: 20.0000 meq | EXTENDED_RELEASE_TABLET | Freq: Every day | ORAL | 3 refills | Status: DC

## 2023-09-11 MED ORDER — FUROSEMIDE 20 MG PO TABS: 20.0000 mg | ORAL_TABLET | Freq: Every day | ORAL | 3 refills | Status: DC

## 2023-09-11 NOTE — Progress Notes (Signed)
 Cardiology Office Note  Date:  09/11/2023   ID:  Haley Mason, DOB 06-18-1946, MRN [id number]  PCP:  Marguarite Arbour, MD   Chief Complaint  Patient presents with   New Patient (Initial Visit)    Ref by Dr. Meredeth Ide for shortness of breath & pulmonary HTN.  Patient c/o shortness of breath with little to no exertion, swelling in feet, mild chest pain x 3 episodes within the past week.     HPI:  Haley Mason is a 78 year old woman with past medical history of SOB Hx of smoking 23 years COPD Chronic hyponatremia Who presents by referral from Dr. Meredeth Ide for shortness of breath, pulmonary hypertension  On discussions today she reports having chronic longstanding shortness of breath dating back 25 years 2 weeks ago developed Sore throat , echo in ear,  Developed acute shortness of breath Seen by medicine/primary care, given ABX, Zpak, sats 90% on RA X-ray results on that day not available She reports she has continued to have shortness of breath Seen by Dr. Meredeth Ide, pulmonary, given nebulizers, they report mild improvement in symptoms Has nebulizers at home but reports continued shortness of breath Has inhaler, has been doing nebulizer 1 week, can't do prednisone  Reports she is in a wheelchair she is unable to walk a very short distance before having to stop secondary to shortness of breath Denies significant lower extremity edema, no PND orthopnea, no abdominal swelling Lots of water, has history of chronic hyponatremia  Echo performed March 2024 [facility] system EF greater than 55% Mild TR,  Review of data indicates right ventricular systolic pressure estimated 40+ RA pressure  EKG personally reviewed by myself on todays visit EKG Interpretation Date/Time:  Friday September 11 2023 10:01:50 EDT Ventricular Rate:  75 PR Interval:  174 QRS Duration:  98 QT Interval:  382 QTC Calculation: 426 R Axis:   34  Text Interpretation: Normal sinus rhythm When compared with ECG of  11-Sep-2023 09:40, No significant change was found Confirmed by Julien Nordmann [phone]) on 09/11/2023 12:00:33 PM    PMH:   has a past medical history of Cystocele, unspecified (CODE), High cholesterol, Hypertension, Kidney disease, chronic, 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  PSH:    Past Surgical History:  Procedure Laterality Date   ABDOMINAL HYSTERECTOMY     ANTERIOR AND POSTERIOR REPAIR WITH SACROSPINOUS FIXATION N/A 03/09/2017   Procedure: ANTERIOR AND POSTERIOR REPAIR WITH SACROSPINOUS FIXATION;  Surgeon: Herold Harms, MD;  Location: [facility] ORS;  Service: Gynecology;  Laterality: N/A;   APPENDECTOMY     COLONOSCOPY N/A 11/17/2022   Procedure: COLONOSCOPY;  Surgeon: Jaynie Collins, DO;  Location: [facility];  Service: Gastroenterology;  Laterality: N/A;   LAPAROSCOPIC VAGINAL HYSTERECTOMY WITH SALPINGO OOPHORECTOMY Bilateral 03/09/2017   Procedure: LAPAROSCOPIC ASSISTED VAGINAL HYSTERECTOMY WITH BILATERAL SALPINGO OOPHORECTOMY;  Surgeon: Herold Harms, MD;  Location: [facility] ORS;  Service: Gynecology;  Laterality: Bilateral;   TONSILLECTOMY     TUBAL LIGATION      Current Outpatient Medications  Medication Sig Dispense Refill   albuterol (PROVENTIL) (2.5 MG/3ML) 0.083% nebulizer solution Take 2.5 mg by nebulization every 6 (six) hours as needed.     albuterol (VENTOLIN HFA) 108 (90 Base) MCG/ACT inhaler Inhale 1-2 puffs into the lungs every 4 (four) hours as needed.     amLODipine (NORVASC) 2.5 MG tablet Take 2.5 mg by mouth at bedtime.     amLODipine (NORVASC) 5 MG tablet Take 5 mg by mouth daily at  12 noon.     atenolol (TENORMIN) 25 MG tablet Take 25 mg by mouth daily.      Cholecalciferol (VITAMIN D3) 2000 units TABS Take 2,000 Units by mouth daily.      conjugated estrogens (PREMARIN) vaginal cream Place 0.5 Applicatorfuls vaginally 2 (two) times a week. 30 g 3   Cranberry (ELLURA) 200 MG CAPS Take by mouth.     docusate sodium (COLACE)  100 MG capsule Take 1 capsule (100 mg total) by mouth 2 (two) times daily. 10 capsule 0   ezetimibe (ZETIA) 10 MG tablet Take 10 mg by mouth daily.     furosemide (LASIX) 20 MG tablet Take 1 tablet (20 mg total) by mouth daily. 90 tablet 3   Multiple Vitamin (MULTI-VITAMIN) tablet Take 1 tablet by mouth daily.     Travoprost, BAK Free, (TRAVATAN) 0.004 % SOLN ophthalmic solution Place 1 drop into both eyes at bedtime.     TRELEGY ELLIPTA 100-62.5-25 MCG/ACT AEPB Inhale into the lungs.     potassium chloride SA (KLOR-CON M) 10 MEQ tablet Take 1 tablet (10 mEq total) by mouth daily. 90 tablet 3   Current Facility-Administered Medications  Medication Dose Route Frequency Provider Last Rate Last Admin   lactated ringers infusion   Intravenous Continuous Defrancesco, Prentice Docker, MD         Allergies:   Penicillins and Prednisone   Social History:  The patient  reports that she quit smoking about 34 years ago. Her smoking use included cigarettes. She has never used smokeless tobacco. She reports that she does not drink alcohol and does not use drugs.   Family History:   family history includes Diabetes in her maternal aunt and mother; Heart disease in her mother and sister.    Review of Systems: Review of Systems  Constitutional: Negative.   HENT: Negative.    Respiratory:  Positive for shortness of breath.   Cardiovascular: Negative.   Gastrointestinal: Negative.   Musculoskeletal: Negative.   Neurological: Negative.   Psychiatric/Behavioral: Negative.    All other systems reviewed and are negative.    PHYSICAL EXAM: VS:  BP (!) 140/64 (BP Location: Right Arm, Patient Position: Sitting, Cuff Size: Normal)   Pulse 78   Ht 5\' 2"  (1.575 m)   Wt 131 lb 4 oz (59.5 kg)   LMP 11/28/2016 (Approximate)   SpO2 94%   BMI 24.01 kg/m  , BMI Body mass index is 24.01 kg/m. GEN: Well nourished, well developed, in no acute distress HEENT: normal Neck: no JVD, carotid bruits, or  masses Cardiac: RRR; no murmurs, rubs, or gallops,no edema  Respiratory: Decreased breath sounds, normal work of breathing GI: soft, nontender, nondistended, + BS MS: no deformity or atrophy Skin: warm and dry, no rash Neuro:  Strength and sensation are intact Psych: euthymic mood, full affect  Recent Labs: 06/18/2023: B Natriuretic Peptide 87.2    Lipid Panel No results found for: "CHOL", "HDL", "LDLCALC", "TRIG"    Wt Readings from Last 3 Encounters:  09/11/23 131 lb 4 oz (59.5 kg)  11/17/22 133 lb 6.4 oz (60.5 kg)  02/25/22 134 lb (60.8 kg)       ASSESSMENT AND PLAN:  Problem List Items Addressed This Visit     Shortness of breath - Primary   Relevant Orders   EKG 12-Lead (Completed)   Other Visit Diagnoses       Pulmonary hypertension, unspecified (HCC)       Relevant Medications   amLODipine (NORVASC) 2.5  MG tablet   amLODipine (NORVASC) 5 MG tablet   ezetimibe (ZETIA) 10 MG tablet   furosemide (LASIX) 20 MG tablet   Other Relevant Orders   ECHOCARDIOGRAM COMPLETE     Centrilobular emphysema (HCC)       Relevant Medications   albuterol (PROVENTIL) (2.5 MG/3ML) 0.083% nebulizer solution   albuterol (VENTOLIN HFA) 108 (90 Base) MCG/ACT inhaler   TRELEGY ELLIPTA 100-62.5-25 MCG/ACT AEPB     History of smoking          Shortness of breath, acute on chronic Underlying COPD, followed by pulmonary Acute onset of worsening shortness of breath 2 weeks ago in the setting of URI symptoms.  Suspect likely COPD exacerbation, reports she is unable to tolerate prednisone She does have nebulizers at home, could consider Breo, steroid inhalers, will defer to pulmonary -Unable to exclude pulmonary hypertension though less likely would be presenting with acute onset 2 weeks ago -Suggested repeat echocardiogram -Also recommend she start Lasix 20 daily with potassium 10.  Moderate fluid intake, BMP when she sees Dr. Judithann Sheen in 2 weeks -For stable BMP.  Try Lasix 40 -If  echo again suggests elevated right heart pressures could consider right heart catheterization -Elevated right heart pressures could be secondary to underlying COPD, treatment options may be limited apart from diuretics  Emphysema Long history of smoking, emphysema on CT scan Clinical exam consistent with COPD exacerbation  Hyperlipidemia On Zetia, statin intolerance  Chronic hyponatremia Recommended free water restriction, add sodium to her diet Discussed various strategies   Signed, Dossie Arbour, M.D., Ph.D. [facility] [place], [place] [phone]

## 2023-09-11 NOTE — Patient Instructions (Addendum)
 Avoid water Drink fluids with sodium/electrolytes   Medication Instructions:  Please start lasix 20 mg daily with potassium 10 meq daily  If you need a refill on your cardiac medications before your next appointment, please call your pharmacy.   Lab work: No new labs needed  Testing/Procedures: Your physician has requested that you have an echocardiogram. Echocardiography is a painless test that uses sound waves to create images of your heart. It provides your doctor with information about the size and shape of your heart and how well your heart's chambers and valves are working.   You may receive an ultrasound enhancing agent through an IV if needed to better visualize your heart during the echo. This procedure takes approximately one hour.  There are no restrictions for this procedure.  This will take place at [address] [facility] [address] [facility]) [address], [place] [address]  Please note: We ask at that you not bring children with you during ultrasound (echo/ vascular) testing. Due to room size and safety concerns, children are not allowed in the ultrasound rooms during exams. Our front office staff cannot provide observation of children in our lobby area while testing is being conducted. An adult accompanying a patient to their appointment will only be allowed in the ultrasound room at the discretion of the ultrasound technician under special circumstances. We apologize for any inconvenience.   Follow-Up: At [facility], you and your health needs are our priority.  As part of our continuing mission to provide you with exceptional heart care, we have created designated Provider Care Teams.  These Care Teams include your primary Cardiologist (physician) and Advanced Practice Providers (APPs -  Physician Assistants and Nurse Practitioners) who all work together to provide you with the care you need, when you need it.  You will need a follow up appointment in as needed  Providers on  your designated Care Team:   Nicolasa Ducking, NP Eula Listen, PA-C Cadence Fransico Michael, [place]  COVID-19 Vaccine Information can be found at: [email or web address] For questions related to vaccine distribution or appointments, please email [email or web address] or call [phone].

## 2023-09-29 ENCOUNTER — Other Ambulatory Visit: Payer: Self-pay | Admitting: Internal Medicine

## 2023-09-29 DIAGNOSIS — R0602 Shortness of breath: Secondary | ICD-10-CM

## 2023-09-29 DIAGNOSIS — I10 Essential (primary) hypertension: Secondary | ICD-10-CM

## 2023-10-05 ENCOUNTER — Ambulatory Visit
Admission: RE | Admit: 2023-10-05 | Discharge: 2023-10-05 | Disposition: A | Discharge status: Home / Self Care | Source: Ambulatory Visit | Attending: Internal Medicine | Admitting: Internal Medicine

## 2023-10-05 ENCOUNTER — Other Ambulatory Visit: Payer: Self-pay | Admitting: Internal Medicine

## 2023-10-05 DIAGNOSIS — Z1231 Encounter for screening mammogram for malignant neoplasm of breast: Secondary | ICD-10-CM

## 2023-10-05 DIAGNOSIS — R0602 Shortness of breath: Secondary | ICD-10-CM

## 2023-10-05 DIAGNOSIS — I10 Essential (primary) hypertension: Secondary | ICD-10-CM

## 2023-10-07 ENCOUNTER — Ambulatory Visit: Attending: Cardiovascular Disease

## 2023-10-07 DIAGNOSIS — R0602 Shortness of breath: Secondary | ICD-10-CM | POA: Insufficient documentation

## 2023-10-07 DIAGNOSIS — I272 Pulmonary hypertension, unspecified: Secondary | ICD-10-CM | POA: Diagnosis present

## 2023-10-07 LAB — ECHOCARDIOGRAM COMPLETE
AR max vel: 1.64 cm2
AV Area VTI: 1.69 cm2
AV Area mean vel: 1.7 cm2
AV Mean grad: 3 mmHg
AV Peak grad: 5 mmHg
Ao pk vel: 1.12 m/s
Area-P 1/2: 3.53 cm2
Calc EF: 52.7 %
S' Lateral: 3.1 cm
Single Plane A2C EF: 51.1 %
Single Plane A4C EF: 53.6 %

## 2023-10-10 ENCOUNTER — Encounter: Payer: Self-pay | Admitting: Cardiovascular Disease

## 2024-01-14 ENCOUNTER — Ambulatory Visit
Admission: RE | Admit: 2024-01-14 | Discharge: 2024-01-14 | Disposition: A | Discharge status: Home / Self Care | Source: Ambulatory Visit | Attending: Internal Medicine | Admitting: Internal Medicine

## 2024-01-14 DIAGNOSIS — Z1231 Encounter for screening mammogram for malignant neoplasm of breast: Secondary | ICD-10-CM | POA: Insufficient documentation

## 2024-02-09 ENCOUNTER — Other Ambulatory Visit: Payer: Self-pay

## 2024-02-09 ENCOUNTER — Emergency Department
Admission: EM | Admit: 2024-02-09 | Discharge: 2024-02-09 | Disposition: A | Discharge status: Home / Self Care | Attending: Emergency Medicine | Admitting: Emergency Medicine

## 2024-02-09 ENCOUNTER — Emergency Department

## 2024-02-09 DIAGNOSIS — N390 Urinary tract infection, site not specified: Secondary | ICD-10-CM | POA: Diagnosis not present

## 2024-02-09 DIAGNOSIS — J449 Chronic obstructive pulmonary disease, unspecified: Secondary | ICD-10-CM | POA: Diagnosis not present

## 2024-02-09 DIAGNOSIS — J984 Other disorders of lung: Secondary | ICD-10-CM

## 2024-02-09 DIAGNOSIS — R911 Solitary pulmonary nodule: Secondary | ICD-10-CM | POA: Diagnosis not present

## 2024-02-09 DIAGNOSIS — R079 Chest pain, unspecified: Secondary | ICD-10-CM

## 2024-02-09 DIAGNOSIS — N189 Chronic kidney disease, unspecified: Secondary | ICD-10-CM | POA: Insufficient documentation

## 2024-02-09 DIAGNOSIS — I129 Hypertensive chronic kidney disease with stage 1 through stage 4 chronic kidney disease, or unspecified chronic kidney disease: Secondary | ICD-10-CM | POA: Insufficient documentation

## 2024-02-09 DIAGNOSIS — R42 Dizziness and giddiness: Secondary | ICD-10-CM

## 2024-02-09 DIAGNOSIS — M549 Dorsalgia, unspecified: Secondary | ICD-10-CM | POA: Diagnosis present

## 2024-02-09 DIAGNOSIS — M546 Pain in thoracic spine: Secondary | ICD-10-CM

## 2024-02-09 LAB — CBC
HCT: 44.4 % (ref 36.0–46.0)
Hemoglobin: 14.6 g/dL (ref 12.0–15.0)
MCH: 27.7 pg (ref 26.0–34.0)
MCHC: 32.9 g/dL (ref 30.0–36.0)
MCV: 84.1 fL (ref 80.0–100.0)
Platelets: 280 K/uL (ref 150–400)
RBC: 5.28 MIL/uL — ABNORMAL HIGH (ref 3.87–5.11)
RDW: 13.1 % (ref 11.5–15.5)
WBC: 8.9 K/uL (ref 4.0–10.5)
nRBC: 0 % (ref 0.0–0.2)

## 2024-02-09 LAB — URINALYSIS, W/ REFLEX TO CULTURE (INFECTION SUSPECTED)
Bacteria, UA: NONE SEEN
Bilirubin Urine: NEGATIVE
Glucose, UA: NEGATIVE mg/dL
Hgb urine dipstick: NEGATIVE
Ketones, ur: NEGATIVE mg/dL
Nitrite: NEGATIVE
Protein, ur: NEGATIVE mg/dL
Specific Gravity, Urine: 1.004 — ABNORMAL LOW (ref 1.005–1.030)
pH: 6 (ref 5.0–8.0)

## 2024-02-09 LAB — BASIC METABOLIC PANEL WITH GFR
Anion gap: 12 (ref 5–15)
BUN: 17 mg/dL (ref 8–23)
CO2: 23 mmol/L (ref 22–32)
Calcium: 9.9 mg/dL (ref 8.9–10.3)
Chloride: 96 mmol/L — ABNORMAL LOW (ref 98–111)
Creatinine, Ser: 1.12 mg/dL — ABNORMAL HIGH (ref 0.44–1.00)
GFR, Estimated: 50 mL/min — ABNORMAL LOW (ref >60)
Glucose, Bld: 162 mg/dL — ABNORMAL HIGH (ref 70–99)
Potassium: 4.3 mmol/L (ref 3.5–5.1)
Sodium: 131 mmol/L — ABNORMAL LOW (ref 135–145)

## 2024-02-09 LAB — TROPONIN I (HIGH SENSITIVITY)
Troponin I (High Sensitivity): 3 ng/L (ref <18)
Troponin I (High Sensitivity): 4 ng/L (ref <18)

## 2024-02-09 LAB — D-DIMER, QUANTITATIVE: D-Dimer, Quant: 0.37 ug{FEU}/mL (ref 0.00–0.50)

## 2024-02-09 MED ORDER — SODIUM CHLORIDE 0.9 % IV BOLUS
1000.0000 mL | Freq: Once | INTRAVENOUS | Status: AC
  Administered 2024-02-09 (×2): 1000 mL via INTRAVENOUS

## 2024-02-09 MED ORDER — FOSFOMYCIN TROMETHAMINE 3 G PO PACK
3.0000 g | PACK | Freq: Once | ORAL | Status: AC
  Administered 2024-02-09 (×2): 3 g via ORAL
  Filled 2024-02-09: qty 3

## 2024-02-09 NOTE — Discharge Instructions (Signed)
 Please make sure to follow-up with your pulmonologist for further management of the lung nodule noted on your CAT scan of your chest.  I have also put in for a referral for oncology to follow-up as well.  For your chest pain please make sure to follow-up with your cardiologist to be evaluated this week or early next week.

## 2024-02-09 NOTE — ED Triage Notes (Signed)
 Pt c/o CP with dizziness and near syncope that started today around noon. Pt denies any cardiac hx. Pt has hypertension and kidney disease.

## 2024-02-09 NOTE — ED Provider Notes (Signed)
 [facility] Provider Note    Event Date/Time   First MD Initiated Contact with Patient 02/09/24 1800     (approximate)   History   Chest Pain   HPI  Haley Mason is a 78 y.o. female with history of hypertension, CKD, COPD, presenting with right sided back pain as well as chest pain.  States has been ongoing for several days, is pleuritic.  She denies any shortness of breath or cough or fever.  States that the pain is reproducible.  States that the pain is improving at this time.  She denies any history of malignancy or blood clots, no unilateral calf sign or tenderness, no lower extremity edema, no history of CHF or prior heart attacks.  No other infectious symptoms, no nausea vomiting or diarrhea.  States that this morning when she was getting up to iron close, felt lightheaded and dizzy.  States that she was passed out.  States that she had not much to drink today.  She denies any headache, no current vision changes, no weakness or numbness.  States that her ambulation is steady at this time.  On independent chart review, she saw her pulmonologist in May, had an echo that showed normal EF, had a CT chest done in April that showed worsening chronic interstitial lung disease as well as bilateral upper lobe nodules.     Physical Exam   Triage Vital Signs: ED Triage Vitals  Encounter Vitals Group     BP 02/09/24 1353 (!) 150/84     Girls Systolic BP Percentile --      Girls Diastolic BP Percentile --      Boys Systolic BP Percentile --      Boys Diastolic BP Percentile --      Pulse Rate 02/09/24 1353 69     Resp 02/09/24 1353 16     Temp 02/09/24 1353 98 F (36.7 C)     Temp Source 02/09/24 1353 Oral     SpO2 02/09/24 1353 96 %     Weight 02/09/24 1354 127 lb (57.6 kg)     Height 02/09/24 1354 5' 2 (1.575 m)     Head Circumference --      Peak Flow --      Pain Score 02/09/24 1354 3     Pain Loc --      Pain Education --      Exclude from  Growth Chart --     Most recent vital signs: Vitals:   02/09/24 2000 02/09/24 2046  BP: (!) 160/67 (!) 160/66  Pulse: 63 68  Resp:  18  Temp:  98 F (36.7 C)  SpO2: 98% 98%     General: Awake, no distress.  CV:  Good peripheral perfusion.  Resp:  Normal effort.  Clear, no anterior thoracic cage tenderness Abd:  No distention.  Soft nontender Other:  She does have tenderness medial to her right scapula.  No cranial nerve deficits, pupils are equal reactive, extraocular movements are intact, no focal weakness or numbness, she is steady gait with ambulation.  No unilateral calf swelling or tenderness, no lower extremity edema.  Dry oral mucosa.  Equal radial pulses bilaterally.   ED Results / Procedures / Treatments   Labs (all labs ordered are listed, but only abnormal results are displayed) Labs Reviewed  BASIC METABOLIC PANEL WITH GFR - Abnormal; Notable for the following components:      Result Value   Sodium 131 (*)  Chloride 96 (*)    Glucose, Bld 162 (*)    Creatinine, Ser 1.12 (*)    GFR, Estimated 50 (*)    All other components within normal limits  CBC - Abnormal; Notable for the following components:   RBC 5.28 (*)    All other components within normal limits  URINALYSIS, W/ REFLEX TO CULTURE (INFECTION SUSPECTED) - Abnormal; Notable for the following components:   Color, Urine STRAW (*)    APPearance CLEAR (*)    Specific Gravity, Urine 1.004 (*)    Leukocytes,Ua SMALL (*)    All other components within normal limits  D-DIMER, QUANTITATIVE  TROPONIN I (HIGH SENSITIVITY)  TROPONIN I (HIGH SENSITIVITY)     EKG  EKG shows, sinus rhythm, rate 70, normal QS, normal QTc, no obvious ischemic ST elevation, T wave flattening in aVL, T wave flattening is new compared to prior   RADIOLOGY On my independent interpretation, chest x-ray shows right upper lobe opacity   PROCEDURES:  Critical Care performed: No  Procedures   MEDICATIONS ORDERED IN  ED: Medications  sodium chloride  0.9 % bolus 1,000 mL (0 mLs Intravenous Stopped 02/09/24 2046)  fosfomycin (MONUROL ) packet 3 g (3 g Oral Given 02/09/24 2043)     IMPRESSION / MDM / ASSESSMENT AND PLAN / ED COURSE  I reviewed the triage vital signs and the nursing notes.                              Differential diagnosis includes, but is not limited to, ACS, angina, musculoskeletal pain, strain, she denies any new cough or shortness of breath this time at this time to suggest pneumonia or viral illness, no wheezing on exam to suggest COPD exacerbation, no lower extremity edema or crackles on exam to suggest CHF, given that she described her pain as pleuritic, did consider PE but she has no history of cancer, no history of hormone use, recent travel surgeries, unilateral calf swing or tenderness, she is not hypoxic or tachycardic.  Also considered dissection but this is less likely given that the pain is not sharp or tearing, is reproducible, she has no paresthesias or focal deficits on exam, also not overtly hypertensive and has equal pulses bilaterally.  For the lightheadedness, dizziness, considered arrhythmia, electrolyte derangements, dehydration, no focal deficits at this time to suggest CVA.  Labs, EKG, troponin, chest x-ray were obtained out of triage, CT chest was also obtained after chest x-ray showed worsening opacity at the right upper lobe concerning for worsening nodularities.  Will add on D-dimer, repeat Trope, UA, fluids.  Patient's presentation is most consistent with acute presentation with potential threat to life or bodily function.  Independent interpretation of labs and imaging below.  Clinical course below.  Troponin x 2 is negative, D-dimer is not elevated.  No indication for inpatient admission at this time, she safe for outpatient management.  Instructed her to follow-up with Dr. Theotis from pulm for further management of her CT chest findings, also put in referral for  oncology.  Instructed her to follow-up with her cardiologist for further management of chest pain and to get reassessed either late this week or early next week.  Shared decision making to patient and she is agreeable with this plan.  Will discharge with strict return precautions.  The patient is on the cardiac monitor to evaluate for evidence of arrhythmia and/or significant heart rate changes.   Clinical Course as of  02/09/24 2334  Tue Feb 09, 2024  1801 DG Chest 2 View IMPRESSION: 1. Potential increase in density of the RIGHT upper lobe pulmonary nodule. Recommend CT thorax to exclude enlarging pulmonary nodule. 2. No acute cardiopulmonary findings.   [TT]  1802 CT CHEST WO CONTRAST IMPRESSION: 1. New band of opacity in the right upper lobe with slightly spiculated margins. This appears contiguous with previously identified nodular densities in the right upper lobe. Findings may be infectious/inflammatory, but neoplasm can not be excluded. 2. New linear band of opacity in the right lower lobe may represent atelectasis. 3. Stable branching nodular opacity in the left lower lobe may represent mucous plugging. 4. New 3 mm nodule in the left upper lobe. 5. 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 6. Stable chronic compression fracture of L1.  Aortic Atherosclerosis (ICD10-I70.0) and Emphysema (ICD10-J43.9).   [TT]  1859 Independent review of labs, mild hyponatremia, AKI, rest electrolytes not severely deranged, no leukocytosis, initial troponin is negative. [TT]  1911 Discussed with patient about findings on CT as well as chest x-ray, she says that she can follow-up with Dr. Theotis her pulmonologist for further management, will also put in a referral for oncology as well. [TT]  2008 Urinalysis, w/ Reflex to Culture (Infection Suspected) -Urine, Clean Catch(!) UA does show some leuks, 6-10 WBCs but no bacteria.  UA is borderline, it is a clean-catch, given that she felt lightheaded  earlier, will give her a dose of fosfomycin here. [TT]  2009 D-Dimer, Quant: 0.37 Not elevated [TT]  2014 Troponin I (High Sensitivity) Troponin x 2 is negative. [TT]    Clinical Course User Index [TT] Waymond Lorelle Follmer, MD     FINAL CLINICAL IMPRESSION(S) / ED DIAGNOSES   Final diagnoses:  Acute right-sided thoracic back pain  Chest pain, unspecified type  Lightheadedness  Pulmonary lesion  Urinary tract infection without hematuria, site unspecified     Rx / DC Orders   ED Discharge Orders          Ordered    Ambulatory referral to Hematology / Oncology       Comments: Your emergency department provider has referred you to see a hematology/oncology specialist. These are physicians who specialize in blood disorders and cancers, or findings concerning for cancer. You will receive a phone call from the [facility] Office to set up your appointment within 2 business days: [employer] operate Mon - Fri, 8:00 a.m. to 5:00 p.m.; closed for federally recognized holidays. Please be sure your phone is not set to block numbers during this time.   02/09/24 2017             Note:  This document was prepared using Dragon voice recognition software and may include unintentional dictation errors.    Waymond Lorelle Kedzierski, MD 02/09/24 [phone]

## 2024-02-26 ENCOUNTER — Other Ambulatory Visit: Payer: Self-pay | Admitting: Emergency Medicine

## 2024-02-26 DIAGNOSIS — R9389 Abnormal findings on diagnostic imaging of other specified body structures: Secondary | ICD-10-CM

## 2024-03-15 ENCOUNTER — Ambulatory Visit
Admission: RE | Admit: 2024-03-15 | Discharge: 2024-03-15 | Disposition: A | Discharge status: Home / Self Care | Source: Ambulatory Visit | Attending: Emergency Medicine | Admitting: Emergency Medicine

## 2024-03-15 DIAGNOSIS — I700 Atherosclerosis of aorta: Secondary | ICD-10-CM | POA: Insufficient documentation

## 2024-03-15 DIAGNOSIS — J439 Emphysema, unspecified: Secondary | ICD-10-CM | POA: Diagnosis not present

## 2024-03-15 DIAGNOSIS — R9389 Abnormal findings on diagnostic imaging of other specified body structures: Secondary | ICD-10-CM | POA: Insufficient documentation

## 2024-03-15 DIAGNOSIS — R918 Other nonspecific abnormal finding of lung field: Secondary | ICD-10-CM | POA: Diagnosis not present

## 2024-03-15 LAB — GLUCOSE, CAPILLARY: Glucose-Capillary: 108 mg/dL — ABNORMAL HIGH (ref 70–99)

## 2024-03-15 MED ORDER — FLUDEOXYGLUCOSE F - 18 (FDG) INJECTION
6.6000 | Freq: Once | INTRAVENOUS | Status: AC | PRN
  Administered 2024-03-15: 6.99 via INTRAVENOUS

## 2024-03-29 ENCOUNTER — Other Ambulatory Visit: Payer: Self-pay | Admitting: Pulmonary Disease

## 2024-03-30 ENCOUNTER — Encounter
Admission: RE | Admit: 2024-03-30 | Discharge: 2024-03-30 | Disposition: A | Discharge status: Home / Self Care | Source: Ambulatory Visit | Attending: Pulmonary Disease | Admitting: Pulmonary Disease

## 2024-03-30 ENCOUNTER — Other Ambulatory Visit: Payer: Self-pay

## 2024-03-30 ENCOUNTER — Ambulatory Visit
Admission: RE | Admit: 2024-03-30 | Discharge: 2024-03-30 | Disposition: A | Discharge status: Home / Self Care | Source: Ambulatory Visit | Attending: Pulmonary Disease | Admitting: Pulmonary Disease

## 2024-03-30 ENCOUNTER — Other Ambulatory Visit: Payer: Self-pay | Admitting: Pulmonary Disease

## 2024-03-30 DIAGNOSIS — R918 Other nonspecific abnormal finding of lung field: Secondary | ICD-10-CM | POA: Diagnosis present

## 2024-03-30 HISTORY — DX: Gastro-esophageal reflux disease without esophagitis: K21.9

## 2024-03-30 HISTORY — DX: Drug-induced myopathy: G72.0

## 2024-03-30 HISTORY — DX: Chronic obstructive pulmonary disease, unspecified: J44.9

## 2024-03-30 HISTORY — DX: Prediabetes: R73.03

## 2024-03-30 HISTORY — DX: Hyperlipidemia, unspecified: E78.5

## 2024-03-30 NOTE — Patient Instructions (Addendum)
 Your procedure is scheduled on:  FRIDAY OCTOBER 3  Report to the Registration Desk on the 1st floor of the [facility] To find out your arrival time, please call [phone] between 1PM - 3PM on:   THURSDAY OCTOBER 2  If your arrival time is 6:00 am, do not arrive before that time as the [facility] entrance doors do not open until 6:00 am.  REMEMBER: Instructions that are not followed completely may result in serious medical risk, up to and including death; or upon the discretion of your surgeon and anesthesiologist your surgery may need to be rescheduled.  Do not eat food after midnight the night before surgery.  No gum chewing or hard candies.   One week prior to surgery: Stop Anti-inflammatories (NSAIDS) such as Advil, Aleve, Ibuprofen, Motrin, Naproxen, Naprosyn and Aspirin based products such as Excedrin, Goody's Powder, BC Powder. Stop ANY OVER THE COUNTER supplements until after surgery. Cholecalciferol  (VITAMIN D3  Multiple Vitamin (MULTI-VITAMIN)  Probiotic  You may however, continue to take Tylenol  if needed for pain up until the day of surgery.  Continue taking all of your other prescription medications up until the day of surgery.  ON THE DAY OF SURGERY ONLY TAKE THESE MEDICATIONS WITH SIPS OF WATER:  atenolol  (TENORMIN )    Use inhalers on the day of surgery and bring to the hospital. albuterol (VENTOLIN HFA)   No Alcohol for 24 hours before or after surgery.  No Smoking including e-cigarettes for 24 hours before surgery.  No chewable tobacco products for at least 6 hours before surgery.  No nicotine patches on the day of surgery.  Do not use any recreational drugs for at least a week (preferably 2 weeks) before your surgery.  Please be advised that the combination of cocaine and anesthesia may have negative outcomes, up to and including death. If you test positive for cocaine, your surgery will be cancelled.  On the morning of surgery brush your teeth  with toothpaste and water, you may rinse your mouth with mouthwash if you wish. Do not swallow any toothpaste or mouthwash.  Do not wear jewelry, make-up, hairpins, clips or nail polish.  For welded (permanent) jewelry: bracelets, anklets, waist bands, etc.  Please have this removed prior to surgery.  If it is not removed, there is a chance that hospital personnel will need to cut it off on the day of surgery.  Do not wear lotions, powders, or perfumes.   Do not shave body hair from the neck down 48 hours before surgery.  Contact lenses, hearing aids and dentures may not be worn into surgery.  Do not bring valuables to the hospital. [facility] is not responsible for any missing/lost belongings or valuables.   Notify your doctor if there is any change in your medical condition (cold, fever, infection).  Wear comfortable clothing (specific to your surgery type) to the hospital.  After surgery, you can help prevent lung complications by doing breathing exercises.  Take deep breaths and cough every 1-2 hours.   If you are being discharged the day of surgery, you will not be allowed to drive home. You will need a responsible individual to drive you home and stay with you for 24 hours after surgery.   If you are taking public transportation, you will need to have a responsible individual with you.  Please call the Pre-admissions Testing Dept. at [phone] if you have any questions about these instructions.  Surgery Visitation Policy:  Patients having surgery  or a procedure may have two visitors.  Children under the age of 60 must have an adult with them who is not the patient.   Merchandiser, retail to address health-related social needs:  [email or web address]

## 2024-04-01 ENCOUNTER — Ambulatory Visit

## 2024-04-01 ENCOUNTER — Encounter
Admission: RE | Disposition: A | Discharge status: Home / Self Care | Payer: Self-pay | Source: Home / Self Care | Attending: Pulmonary Disease

## 2024-04-01 ENCOUNTER — Ambulatory Visit
Admission: RE | Admit: 2024-04-01 | Discharge: 2024-04-01 | Disposition: A | Discharge status: Home / Self Care | Attending: Pulmonary Disease | Admitting: Pulmonary Disease

## 2024-04-01 ENCOUNTER — Other Ambulatory Visit: Payer: Self-pay

## 2024-04-01 DIAGNOSIS — Z87891 Personal history of nicotine dependence: Secondary | ICD-10-CM | POA: Diagnosis not present

## 2024-04-01 DIAGNOSIS — X58XXXA Exposure to other specified factors, initial encounter: Secondary | ICD-10-CM | POA: Diagnosis not present

## 2024-04-01 DIAGNOSIS — K219 Gastro-esophageal reflux disease without esophagitis: Secondary | ICD-10-CM | POA: Insufficient documentation

## 2024-04-01 DIAGNOSIS — N182 Chronic kidney disease, stage 2 (mild): Secondary | ICD-10-CM | POA: Diagnosis not present

## 2024-04-01 DIAGNOSIS — R918 Other nonspecific abnormal finding of lung field: Secondary | ICD-10-CM | POA: Diagnosis present

## 2024-04-01 DIAGNOSIS — R590 Localized enlarged lymph nodes: Secondary | ICD-10-CM | POA: Diagnosis not present

## 2024-04-01 DIAGNOSIS — T17500A Unspecified foreign body in bronchus causing asphyxiation, initial encounter: Secondary | ICD-10-CM | POA: Insufficient documentation

## 2024-04-01 DIAGNOSIS — E7800 Pure hypercholesterolemia, unspecified: Secondary | ICD-10-CM | POA: Diagnosis not present

## 2024-04-01 DIAGNOSIS — I129 Hypertensive chronic kidney disease with stage 1 through stage 4 chronic kidney disease, or unspecified chronic kidney disease: Secondary | ICD-10-CM | POA: Diagnosis not present

## 2024-04-01 DIAGNOSIS — R911 Solitary pulmonary nodule: Secondary | ICD-10-CM | POA: Diagnosis not present

## 2024-04-01 DIAGNOSIS — J439 Emphysema, unspecified: Secondary | ICD-10-CM | POA: Insufficient documentation

## 2024-04-01 HISTORY — PX: VIDEO BRONCHOSCOPY WITH ENDOBRONCHIAL ULTRASOUND: SHX6177

## 2024-04-01 HISTORY — PX: VIDEO BRONCHOSCOPY WITH ENDOBRONCHIAL NAVIGATION: SHX6175

## 2024-04-01 SURGERY — BRONCHOSCOPY, WITH EBUS
Anesthesia: General

## 2024-04-01 MED ORDER — PHENYLEPHRINE HCL-NACL 20-0.9 MG/250ML-% IV SOLN
INTRAVENOUS | Status: AC
Start: 2024-04-01 — End: 2024-04-01
  Filled 2024-04-01: qty 250

## 2024-04-01 MED ORDER — DEXAMETHASONE SODIUM PHOSPHATE 10 MG/ML IJ SOLN
INTRAMUSCULAR | Status: DC | PRN
  Administered 2024-04-01: 5 mg via INTRAVENOUS

## 2024-04-01 MED ORDER — PROPOFOL 10 MG/ML IV BOLUS
INTRAVENOUS | Status: DC | PRN
  Administered 2024-04-01: 50 mg via INTRAVENOUS
  Administered 2024-04-01: 80 mg via INTRAVENOUS

## 2024-04-01 MED ORDER — ONDANSETRON HCL 4 MG/2ML IJ SOLN
INTRAMUSCULAR | Status: DC | PRN
  Administered 2024-04-01: 4 mg via INTRAVENOUS

## 2024-04-01 MED ORDER — PROPOFOL 500 MG/50ML IV EMUL
INTRAVENOUS | Status: DC | PRN
  Administered 2024-04-01: 125 ug/kg/min via INTRAVENOUS

## 2024-04-01 MED ORDER — ROCURONIUM BROMIDE 100 MG/10ML IV SOLN
INTRAVENOUS | Status: DC | PRN
  Administered 2024-04-01: 50 mg via INTRAVENOUS

## 2024-04-01 MED ORDER — LACTATED RINGERS IV SOLN: INTRAVENOUS | Status: DC

## 2024-04-01 MED ORDER — ROCURONIUM BROMIDE 10 MG/ML (PF) SYRINGE
PREFILLED_SYRINGE | INTRAVENOUS | Status: AC
Start: 2024-04-01 — End: 2024-04-01
  Filled 2024-04-01: qty 10

## 2024-04-01 MED ORDER — CHLORHEXIDINE GLUCONATE 0.12 % MT SOLN: 15.0000 mL | Freq: Once | OROMUCOSAL | Status: AC

## 2024-04-01 MED ORDER — FENTANYL CITRATE (PF) 100 MCG/2ML IJ SOLN: 25.0000 ug | INTRAMUSCULAR | Status: DC | PRN

## 2024-04-01 MED ORDER — CHLORHEXIDINE GLUCONATE 0.12 % MT SOLN
OROMUCOSAL | Status: AC
  Filled 2024-04-01: qty 15

## 2024-04-01 MED ORDER — ORAL CARE MOUTH RINSE
15.0000 mL | Freq: Once | OROMUCOSAL | Status: AC
  Administered 2024-04-01: 15 mL via OROMUCOSAL

## 2024-04-01 MED ORDER — ONDANSETRON HCL 4 MG/2ML IJ SOLN: 4.0000 mg | Freq: Once | INTRAMUSCULAR | Status: DC | PRN

## 2024-04-01 MED ORDER — LACTATED RINGERS IV SOLN: INTRAVENOUS | Status: DC | PRN

## 2024-04-01 MED ORDER — LIDOCAINE HCL (CARDIAC) PF 100 MG/5ML IV SOSY
PREFILLED_SYRINGE | INTRAVENOUS | Status: DC | PRN
  Administered 2024-04-01: 80 mg via INTRAVENOUS

## 2024-04-01 MED ORDER — LIDOCAINE HCL (PF) 2 % IJ SOLN
INTRAMUSCULAR | Status: AC
  Filled 2024-04-01: qty 5

## 2024-04-01 MED ORDER — PROPOFOL 10 MG/ML IV BOLUS
INTRAVENOUS | Status: AC
Start: 2024-04-01 — End: 2024-04-01
  Filled 2024-04-01: qty 20

## 2024-04-01 MED ORDER — FENTANYL CITRATE (PF) 100 MCG/2ML IJ SOLN
INTRAMUSCULAR | Status: DC | PRN
  Administered 2024-04-01 (×2): 50 ug via INTRAVENOUS

## 2024-04-01 MED ORDER — SUGAMMADEX SODIUM 200 MG/2ML IV SOLN
INTRAVENOUS | Status: DC | PRN
Start: 2024-04-01 — End: 2024-04-01
  Administered 2024-04-01: 100 mg via INTRAVENOUS

## 2024-04-01 MED ORDER — FENTANYL CITRATE (PF) 100 MCG/2ML IJ SOLN
INTRAMUSCULAR | Status: AC
  Filled 2024-04-01: qty 2

## 2024-04-01 MED ORDER — PROPOFOL 1000 MG/100ML IV EMUL
INTRAVENOUS | Status: AC
  Filled 2024-04-01: qty 100

## 2024-04-01 NOTE — H&P (Signed)
 PULMONOLOGY         Date: 04/01/2024,   MRN# [id number] Haley Mason Feb 11, 1946     AdmissionWeight: 57.6 kg                 CurrentWeight: 57.6 kg  Referring provider: Dr Theotis   CHIEF COMPLAINT:   Right upper lobe nodule with hilar adenopathy   HISTORY OF PRESENT ILLNESS   This is a 78 yo F with hx of COPD and bullous emphysema.  She also has a history of cystocele, GERD, dyslipidemia, renal failure, statin myopathy and urge incontinence. She was noted to have spiculated Right upper lobe nodule and right hilar adenopathy with PET avidity in both locations.  Today she is here for bronchoscopy and lung biopsy for definitive diagnosis and rule out cancer. We are planning to perform flexible bronchoscopy with BAL and therapeutic aspiration of tracheobronchial tree.  Following this we will need to do navigation bronchoscopy for lung biopsy of RUL lesion. Lastly we will evaluate her lymph node stations with endobronchial US . Reviewed risks/complications and benefits with patient, risks include infection, pneumothorax/pneumomediastinum which may require chest tube placement as well as overnight/prolonged hospitalization and possible mechanical ventilation. Other risks include bleeding and very rarely death.  Patient understands risks and wishes to proceed.  Additional questions were answered, and patient is aware that post procedure patient will be going home with family and may experience cough with possible clots on expectoration as well as phlegm which may last few days as well as hoarseness of voice post intubation and mechanical ventilation.    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)    Pre-diabetes    Renal insufficiency    Statin myopathy    Urge incontinence 01/14/2017   Vaginal atrophy 01/14/2017     SURGICAL HISTORY   Past Surgical History:  Procedure Laterality Date   ABDOMINAL HYSTERECTOMY     ANTERIOR AND POSTERIOR REPAIR WITH SACROSPINOUS FIXATION N/A 03/09/2017   Procedure: ANTERIOR AND POSTERIOR REPAIR WITH SACROSPINOUS FIXATION;  Surgeon: Kathe Gladis LABOR, MD;  Location: [facility] ORS;  Service: Gynecology;  Laterality: N/A;   APPENDECTOMY     COLONOSCOPY N/A 11/17/2022   Procedure: COLONOSCOPY;  Surgeon: Onita Elspeth Sharper, DO;  Location: [facility] END[facility]COPY;  Service: Gastroenterology;  Laterality: N/A;   EYE SURGERY     LAPAROSCOPIC VAGINAL HYSTERECTOMY WITH SALPINGO OOPHORECTOMY Bilateral 03/09/2017   Procedure: LAPAROSCOPIC ASSISTED VAGINAL HYSTERECTOMY WITH BILATERAL SALPINGO OOPHORECTOMY;  Surgeon: Kathe Gladis LABOR, MD;  Location: [facility] ORS;  Service: Gynecology;  Laterality: Bilateral;   TONSILLECTOMY     TUBAL LIGATION       FAMILY HISTORY   Family History  Problem Relation Age of Onset   Diabetes Mother    Heart disease Mother    Heart disease Sister    Diabetes Maternal Aunt    Breast cancer Neg Hx    Ovarian cancer Neg Hx    Colon cancer Neg Hx      SOCIAL HISTORY   Social History   Tobacco Use   Smoking status: Former    Current packs/day: 0.00    Types: Cigarettes    Quit date: 50  Years since quitting: 34.7   Smokeless tobacco: Never  Vaping Use   Vaping status: Never Used  Substance Use Topics   Alcohol use: No   Drug use: No     MEDICATIONS    Home Medication:    Current Medication:  Current Facility-Administered Medications:    lactated ringers  infusion, , Intravenous, Continuous, Dario Barter, MD, Last Rate: 10 mL/hr at 04/01/24 0806, New Bag at 04/01/24 0806    ALLERGIES   Penicillins and Prednisone     REVIEW OF SYSTEMS    Review of Systems:  Gen:  Denies   fever, sweats, chills weigh loss  HEENT: Denies blurred vision, double vision, ear pain, eye pain, hearing loss, nose bleeds, sore throat Cardiac:  No dizziness, chest pain or heaviness, chest tightness,edema Resp:   reports dyspnea chronically  Gi: Denies swallowing difficulty, stomach pain, nausea or vomiting, diarrhea, constipation, bowel incontinence Gu:  Denies bladder incontinence, burning urine Ext:   Denies Joint pain, stiffness or swelling Skin: Denies  skin rash, easy bruising or bleeding or hives Endoc:  Denies polyuria, polydipsia , polyphagia or weight change Psych:   Denies depression, insomnia or hallucinations   Other:  All other systems negative   VS: BP (!) 159/68 (BP Location: Right Arm)   Temp (!) 97.5 F (36.4 C) (Temporal)   Resp 20   Ht 5' 2 (1.575 m)   Wt 57.6 kg   LMP 11/28/2016 (Approximate)   SpO2 97%   BMI 23.23 kg/m      PHYSICAL EXAM    GENERAL:NAD, no fevers, chills, no weakness no fatigue HEAD: Normocephalic, atraumatic.  EYES: Pupils equal, round, reactive to light. Extraocular muscles intact. No scleral icterus.  MOUTH: Moist mucosal membrane. Dentition intact. No abscess noted.  EAR, NOSE, THROAT: Clear without exudates. No external lesions.  NECK: Supple. No thyromegaly. No nodules. No JVD.  PULMONARY: decreased breath sounds with mild rhonchi worse at bases bilaterally.  CARDIOVASCULAR: S1 and S2. Regular rate and rhythm. No murmurs, rubs, or gallops. No edema. Pedal pulses 2+ bilaterally.  GASTROINTESTINAL: Soft, nontender, nondistended. No masses. Positive bowel sounds. No hepatosplenomegaly.  MUSCULOSKELETAL: No swelling, clubbing, or edema. Range of motion full in all extremities.  NEUROLOGIC: Cranial nerves II through XII are intact. No gross focal neurological deficits. Sensation intact. Reflexes intact.  SKIN: No ulceration, lesions, rashes, or cyanosis. Skin warm and dry. Turgor intact.  PSYCHIATRIC: Mood, affect within normal  limits. The patient is awake, alert and oriented x 3. Insight, judgment intact.       IMAGING    Narrative & Impression  Narrative & Impression  CLINICAL DATA:  Dizziness, lung nodule.   EXAM: CT CHEST WITHOUT CONTRAST   TECHNIQUE: Multidetector CT imaging of the chest was performed following the standard protocol without IV contrast.   RADIATION DOSE REDUCTION: This exam was performed according to the departmental dose-optimization program which includes automated exposure control, adjustment of the mA and/or kV according to patient size and/or use of iterative reconstruction technique.   COMPARISON:  Chest x-ray same day.  Chest CT 10/05/2023.   FINDINGS: Cardiovascular: No significant vascular findings. Normal heart size. No pericardial effusion. There are atherosclerotic calcifications of the aorta.   Mediastinum/Nodes: Peripherally calcified nodule in the right thyroid measures 8 mm, unchanged. No enlarged mediastinal, hilar or axillary lymph nodes are seen. Visualized esophagus is within normal limits.   Lungs/Pleura: Moderate emphysema again noted in the upper lungs. There is a new band of opacity in the right  upper lobe with slightly spiculated margins measuring 4.8 by 1.1 by 1.6 cm. This appears contiguous with previously identified nodular densities in the right upper lobe. There is a new linear band of opacity in the right lower lobe which may represent atelectasis. There is a stable slightly branching nodular opacity in the left lower lobe which may represent mucous plugging. Left apical nodule image 3/24 measures 6 by 3 mm and has decreased in size. Tree-in-bud, ground-glass and nodular opacities throughout the left upper lobe resolved in the interval. There is a single left upper lobe pulmonary nodule measuring 3 mm image 3/60 which was not seen on prior. There are 2 other nodular densities measuring 2 mm in the right lower lobe.   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 No acute abnormality in the upper abdomen.   Musculoskeletal: Chronic compression fracture of L1 is unchanged.   IMPRESSION: 1. New band of opacity in the right upper lobe with slightly spiculated margins. This appears contiguous with previously identified nodular densities in the right upper lobe. Findings may be infectious/inflammatory, but neoplasm can not be excluded. 2. New linear band of opacity in the right lower lobe may represent atelectasis. 3. Stable branching nodular opacity in the left lower lobe may represent mucous plugging. 4. New 3 mm nodule in the left upper lobe. 5. 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 6. Stable chronic compression fracture of L1.   Aortic Atherosclerosis (ICD10-I70.0) and Emphysema (ICD10-J43.9).     Electronically Signed   By: Greig Pique M.D.   On: 02/09/2024 16:25       ASSESSMENT/PLAN    Right upper lobe nodule with hilar adenopathy Today she is here for bronchoscopy and lung biopsy for definitive diagnosis and rule out cancer. We are planning to perform flexible bronchoscopy with BAL and therapeutic aspiration of tracheobronchial tree.  Following this we will need to do navigation bronchoscopy for lung biopsy of RUL lesion. Lastly we will evaluate her lymph node stations with endobronchial US . Reviewed risks/complications and benefits with patient, risks include infection, pneumothorax/pneumomediastinum which may require chest tube placement as well as overnight/prolonged hospitalization and possible mechanical ventilation. Other risks include bleeding and very rarely death.  Patient understands risks and wishes to proceed.  Additional questions were answered, and patient is aware that post procedure patient will be going home with family and may experience cough with possible clots on expectoration as well as phlegm which may last few days as well as hoarseness  of voice post intubation and mechanical ventilation.       Thank you for allowing me to participate in the care of this patient.   Patient/Family are satisfied with care plan and all questions have been answered.    Provider disclosure: Patient with at least one acute or chronic illness or injury that poses a threat to life or bodily function and is being managed actively during this encounter.  All of the below services have been performed independently by signing provider:  review of prior documentation from internal and or external health records.  Review of previous and current lab results.  Interview and comprehensive assessment during patient visit today. Review of current and previous chest radiographs/CT scans. Discussion of management and test interpretation with health care team and patient/family.   This document was prepared using Dragon voice recognition software and may include unintentional dictation errors.     Korie Streat, M.D.  Division of Pulmonary & Critical Care  Medicine

## 2024-04-01 NOTE — Transfer of Care (Signed)
 Immediate Anesthesia Transfer of Care Note  Patient: Haley Mason  Procedure(s) Performed: BRONCHOSCOPY, WITH EBUS VIDEO BRONCHOSCOPY WITH ENDOBRONCHIAL NAVIGATION  Patient Location: PACU  Anesthesia Type:General  Level of Consciousness: awake  Airway & Oxygen Therapy: Patient Spontanous Breathing  Post-op Assessment: Report given to RN and Post -op Vital signs reviewed and stable  Post vital signs: Reviewed and stable  Last Vitals:  Vitals Value Taken Time  BP 154/65 04/01/24 11:51  Temp 36.1 C 04/01/24 11:51  Pulse 67 04/01/24 11:51  Resp 16 04/01/24 11:51  SpO2 95 % 04/01/24 11:51    Last Pain:  Vitals:   04/01/24 1151  TempSrc: Temporal  PainSc: 0-No pain         Complications: No notable events documented.

## 2024-04-01 NOTE — Anesthesia Postprocedure Evaluation (Signed)
 Anesthesia Post Note  Patient: Haley Mason  Procedure(s) Performed: BRONCHOSCOPY, WITH EBUS VIDEO BRONCHOSCOPY WITH ENDOBRONCHIAL NAVIGATION  Patient location during evaluation: PACU Anesthesia Type: General Level of consciousness: sedated Pain management: pain level controlled Vital Signs Assessment: post-procedure vital signs reviewed and stable Respiratory status: spontaneous breathing Cardiovascular status: blood pressure returned to baseline Anesthetic complications: no   No notable events documented.   Last Vitals:  Vitals:   04/01/24 1140 04/01/24 1151  BP: (!) 143/75 (!) 154/65  Pulse: 62 67  Resp: 19 16  Temp: (!) 36.3 C (!) 36.1 C  SpO2: 95% 95%    Last Pain:  Vitals:   04/01/24 1151  TempSrc: Temporal  PainSc: 0-No pain                 VAN STAVEREN,Melea Prezioso

## 2024-04-01 NOTE — Anesthesia Preprocedure Evaluation (Addendum)
 Anesthesia Evaluation  Patient identified by MRN, date of birth, ID band Patient awake    Reviewed: Allergy & Precautions, NPO status , Patient's Chart, lab work & pertinent test results  Airway Mallampati: II  TM Distance: >3 FB Neck ROM: full    Dental  (+) Teeth Intact   Pulmonary shortness of breath, COPD, former smoker   Pulmonary exam normal  + decreased breath sounds      Cardiovascular Exercise Tolerance: Good hypertension, Pt. on medications Normal cardiovascular exam Rhythm:Regular     Neuro/Psych negative neurological ROS  negative psych ROS   GI/Hepatic negative GI ROS, Neg liver ROS,GERD  Medicated,,  Endo/Other  negative endocrine ROS    Renal/GU CRFRenal disease  negative genitourinary   Musculoskeletal negative musculoskeletal ROS (+)    Abdominal Normal abdominal exam  (+)   Peds negative pediatric ROS (+)  Hematology negative hematology ROS (+)   Anesthesia Other Findings Past Medical History: No date: COPD (chronic obstructive pulmonary disease) (HCC) No date: Cystocele, unspecified (CODE) No date: GERD (gastroesophageal reflux disease) No date: High cholesterol No date: Hyperlipidemia No date: Hypertension 01/14/2017: Incomplete bladder emptying No date: Kidney disease, chronic, 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) No date: Pre-diabetes No date: Renal insufficiency No date: Statin myopathy 01/14/2017: Urge incontinence 01/14/2017: Vaginal atrophy  Past Surgical History: No date: ABDOMINAL HYSTERECTOMY 03/09/2017: ANTERIOR AND POSTERIOR REPAIR WITH SACROSPINOUS FIXATION;  N/A     Comment:  Procedure: ANTERIOR AND POSTERIOR REPAIR WITH               SACROSPINOUS FIXATION;  Surgeon: Kathe Gladis LABOR,               MD;  Location: [facility] ORS;  Service: Gynecology;                Laterality: N/A; No date: APPENDECTOMY 11/17/2022: COLONOSCOPY; N/A     Comment:  Procedure: COLONOSCOPY;   Surgeon: Onita Elspeth Sharper,              DO;  Location: [facility] ENDOSCOPY;  Service:               Gastroenterology;  Laterality: N/A; No date: EYE SURGERY 03/09/2017: LAPAROSCOPIC VAGINAL HYSTERECTOMY WITH SALPINGO  OOPHORECTOMY; Bilateral     Comment:  Procedure: LAPAROSCOPIC ASSISTED VAGINAL HYSTERECTOMY               WITH BILATERAL SALPINGO OOPHORECTOMY;  Surgeon:               Kathe Gladis LABOR, MD;  Location: [facility] ORS;  Service:              Gynecology;  Laterality: Bilateral; No date: TONSILLECTOMY No date: TUBAL LIGATION  BMI    Body Mass Index: 23.23 kg/m      Reproductive/Obstetrics negative OB ROS                              Anesthesia Physical Anesthesia Plan  ASA: 2  Anesthesia Plan: General   Post-op Pain Management:    Induction: Intravenous  PONV Risk Score and Plan: Ondansetron , Dexamethasone , Midazolam and Treatment may vary due to age or medical condition  Airway Management Planned: Oral ETT  Additional Equipment:   Intra-op Plan:   Post-operative Plan: Extubation in OR  Informed Consent: I have reviewed the patients History and Physical, chart, labs and discussed the procedure including the risks, benefits and alternatives for the proposed anesthesia with the  patient or authorized representative who has indicated his/her understanding and acceptance.     Dental Advisory Given  Plan Discussed with: CRNA  Anesthesia Plan Comments:          Anesthesia Quick Evaluation

## 2024-04-01 NOTE — Anesthesia Procedure Notes (Signed)
 Procedure Name: Intubation Date/Time: 04/01/2024 9:30 AM  Performed by: Brien Sotero PARAS, CRNAPre-anesthesia Checklist: Patient identified, Patient being monitored, Timeout performed, Emergency Drugs available and Suction available Patient Re-evaluated:Patient Re-evaluated prior to induction Oxygen Delivery Method: Circle system utilized Preoxygenation: Pre-oxygenation with 100% oxygen Induction Type: IV induction Ventilation: Mask ventilation without difficulty Laryngoscope Size: Mac and 3 Grade View: Grade I Tube type: Oral Tube size: 8.5 mm Number of attempts: 1 Airway Equipment and Method: Stylet Placement Confirmation: ETT inserted through vocal cords under direct vision, positive ETCO2 and breath sounds checked- equal and bilateral Secured at: 19 cm Tube secured with: Tape Dental Injury: Teeth and Oropharynx as per pre-operative assessment

## 2024-04-01 NOTE — Procedures (Signed)
 ROBOTIC NAVIGATIONAL BRONCHOSCOPY PROCEDURE NOTE  FIBEROPTIC BRONCHOSCOPY WITH BRONCHOALVEOLAR LAVAGE PROCEDURE NOTE  THERAPEUTIC ASPIRATION OF TRACHEOBRONCHIAL TREE  ENDOBRONCHIAL ULTRASOUND X >/3 LYMPH NODE PROCEDURE NOTE  TRANSBRONCHIAL FNA X 1 LOBE  TRANSBRONCHIAL SURGICAL LUNG BIOPSY X 1 LOBE  RADIAL ENDOBRONCHIAL ULTRASOUND  TRANSBRONCHIAL CYTOPATHOLOGY BRUSHINGS    Flexible bronchoscopy was performed  by : Parris MD  assistance by : 1)Repiratory therapist  and 2)cytotech staff and 3) Anesthesia team and 4) Flouroscopy team and 5) IT supporting staff for robotic platform   Indication for the procedure was :  Pre-procedural H&P. The following assessment was performed on the day of the procedure prior to initiating sedation History:  Chest pain n Dyspnea y Hemoptysis n Cough y Fever n Other pertinent items n  Examination Vital signs -reviewed as per nursing documentation today Cardiac    Murmurs: n  Rubs : n  Gallop: n Lungs Wheezing: n Rales : n Rhonchi :y  Other pertinent findings: SOB/hypoxemia due to chronic lung disease   Pre-procedural assessment for Procedural Sedation included: Depth of sedation: As per anesthesia team  ASA Classification:  2 Mallampati airway assessment: 3    Medication list reviewed: y  The patient's interval history was taken and revealed: no new complaints The pre- procedure physical examination revealed: No new findings Refer to prior clinic note for details.  Informed Consent: Informed consent was obtained from:  patient after explanation of procedure and risks, benefits, as well as alternative procedures available.  Explanation of level of sedation and possible transfusion was also provided.    Procedural Preparation: Time out was performed and patient was identified by name and birthdate and procedure to be performed and side for sampling, if any, was specified. Pt was intubated by anesthesia.  The patient was  appropriately draped.   Fiberoptic bronchoscopy with airway inspection, therapeutic aspiration of tracheobronchial tree and BAL Procedure findings:  Bronchoscope was inserted via ETT  without difficulty.  Posterior oropharynx, epiglottis, arytenoids, false cords and vocal cords were not visualized as these were bypassed by endotracheal tube. The distal trachea was normal in circumference and appearance without mucosal, cartilaginous or branching abnormalities.  The main carina was mildly splayed . All right and left lobar airways were NOT visualized to the Subsegmental level due to obstructed airways from phlegm and mucus  plugging. Mucus plugging with partial to complete airway obstruction was noted bilaterally and treated with therapeutic aspiration prior to beginning of robotic bronchoscopy to allow adequate visualization and accurate lung biopsy and improve patients respiratory status.   The mucosa was : friable at target segment of RUL  Airways were notable for:        exophytic lesions :n       extrinsic compression in the following distributions: n.       Friable mucosa: y       Teacher, music /pigmentation: n     Post procedure Diagnosis:   Mucus plugging of tracheobronchial tree      Navigational Bronchoscopy Procedure Findings:   Ion robotic platform utilized for this procedure. Post appropriate planning and registration peripheral navigation was used to visualize target lesion.   Radial endobronchial US  technology utilized during this procedure for confirmation of lesion.  3D Fluoroscopy utilized for tool in lesion confirmation    Target 1 RIGHT UPPER LOBE- Transbronchial cytobrushing x 2 ,   Transbronchial FNA x 2,   Transbronchial surgical pathology x 3 ,    BAL was performed at this location and sent for  processing with cytology and microbiology    Endobronchial ultrasound assisted hilar and mediastinal lymph node biopsies procedure findings: The fiberoptic  bronchoscope was removed and the EBUS scope was introduced. Examination began to evaluate for pathologically enlarged lymph nodes starting on the left side progressing to the RIGHT side.  All lymph node biopsies performed with 21G needle. Lymph node biopsies were sent in cytolite for all stations.  STATION 10L - BIOPSIED 3 TIMES STATION 7 - 1.2CM BIOPSIED 3 TIMES STATION 10R - NOT BIOPSIED STATION 4R - 1.2CM BIOPSIED 3 TIMES  Immediate sampling complications included:NONE immediate  Epinephrine  ZERO ml was used topically  The bronchoscopy was terminated due to completion of the planned procedure and the bronchoscope was removed.   Total dosage of Lidocaine  was 1 mg  Estimated Blood loss: EXPECTED < 5cc.  Complications included:  NONE   Preliminary CXR findings :  IN PROCESS  Disposition: HOME WITH FAMILY   Follow up with Dr. Kaiyu Mirabal in 5 days for result discussion.     Halina Picking MD  [facility] Division of Pulmonary & Critical Care Medicine

## 2024-04-02 ENCOUNTER — Encounter: Payer: Self-pay | Admitting: Pulmonary Disease

## 2024-04-03 LAB — CULTURE, BAL-QUANTITATIVE W GRAM STAIN
Culture: NO GROWTH
Gram Stain: NONE SEEN

## 2024-04-04 ENCOUNTER — Encounter: Payer: Self-pay | Admitting: Pulmonary Disease

## 2024-04-04 LAB — ACID FAST SMEAR (AFB, MYCOBACTERIA): Acid Fast Smear: NEGATIVE

## 2024-04-04 LAB — CYTOLOGY - NON PAP

## 2024-04-04 LAB — SURGICAL PATHOLOGY

## 2024-04-22 ENCOUNTER — Other Ambulatory Visit: Payer: Self-pay | Admitting: Pulmonary Disease

## 2024-04-22 DIAGNOSIS — R918 Other nonspecific abnormal finding of lung field: Secondary | ICD-10-CM

## 2024-04-22 LAB — CULTURE, FUNGUS WITHOUT SMEAR

## 2024-05-16 LAB — ACID FAST CULTURE WITH REFLEXED SENSITIVITIES (MYCOBACTERIA): Acid Fast Culture: NEGATIVE

## 2024-11-01 ENCOUNTER — Other Ambulatory Visit
